# Patient Record
Sex: Male | Born: 1975 | Race: White | Hispanic: No | Marital: Single | Smoking: Current every day smoker
Health system: Southern US, Community
[De-identification: ages and names within clinical notes are randomized; demographics above are authoritative.]

## PROBLEM LIST (undated history)

## (undated) DIAGNOSIS — IMO0001 Reserved for inherently not codable concepts without codable children: Secondary | ICD-10-CM

## (undated) DIAGNOSIS — F1120 Opioid dependence, uncomplicated: Secondary | ICD-10-CM

## (undated) DIAGNOSIS — F1910 Other psychoactive substance abuse, uncomplicated: Secondary | ICD-10-CM

## (undated) DIAGNOSIS — K7460 Unspecified cirrhosis of liver: Secondary | ICD-10-CM

## (undated) DIAGNOSIS — K219 Gastro-esophageal reflux disease without esophagitis: Secondary | ICD-10-CM

## (undated) DIAGNOSIS — F419 Anxiety disorder, unspecified: Secondary | ICD-10-CM

## (undated) DIAGNOSIS — I8500 Esophageal varices without bleeding: Secondary | ICD-10-CM

---

## 2003-12-05 ENCOUNTER — Encounter: Admission: RE | Admit: 2003-12-05 | Discharge: 2003-12-05 | Payer: Self-pay | Admitting: Family Medicine

## 2005-02-09 ENCOUNTER — Encounter: Admission: RE | Admit: 2005-02-09 | Discharge: 2005-02-09 | Payer: Self-pay | Admitting: Family Medicine

## 2005-12-07 ENCOUNTER — Encounter: Admission: RE | Admit: 2005-12-07 | Discharge: 2005-12-07 | Payer: Self-pay | Admitting: Family Medicine

## 2007-01-11 ENCOUNTER — Ambulatory Visit: Payer: Self-pay | Admitting: Internal Medicine

## 2007-01-29 ENCOUNTER — Ambulatory Visit: Payer: Self-pay | Admitting: Internal Medicine

## 2007-02-08 ENCOUNTER — Ambulatory Visit: Payer: Self-pay | Admitting: Internal Medicine

## 2007-02-13 ENCOUNTER — Ambulatory Visit: Payer: Self-pay | Admitting: Internal Medicine

## 2007-06-26 ENCOUNTER — Ambulatory Visit: Payer: Self-pay | Admitting: Internal Medicine

## 2007-06-26 DIAGNOSIS — F411 Generalized anxiety disorder: Secondary | ICD-10-CM | POA: Insufficient documentation

## 2008-03-07 ENCOUNTER — Telehealth (INDEPENDENT_AMBULATORY_CARE_PROVIDER_SITE_OTHER): Payer: Self-pay | Admitting: *Deleted

## 2008-12-18 ENCOUNTER — Ambulatory Visit: Payer: Self-pay | Admitting: Family Medicine

## 2008-12-18 DIAGNOSIS — H6690 Otitis media, unspecified, unspecified ear: Secondary | ICD-10-CM | POA: Insufficient documentation

## 2009-03-04 ENCOUNTER — Emergency Department (HOSPITAL_BASED_OUTPATIENT_CLINIC_OR_DEPARTMENT_OTHER): Admission: EM | Admit: 2009-03-04 | Discharge: 2009-03-04 | Payer: Self-pay | Admitting: Emergency Medicine

## 2011-01-15 ENCOUNTER — Encounter: Payer: Self-pay | Admitting: Family Medicine

## 2011-01-17 ENCOUNTER — Ambulatory Visit: Admit: 2011-01-17 | Payer: Self-pay | Admitting: Gastroenterology

## 2011-01-17 DIAGNOSIS — K7689 Other specified diseases of liver: Secondary | ICD-10-CM

## 2011-04-07 LAB — POCT TOXICOLOGY PANEL
Benzodiazepines: POSITIVE
Opiates: POSITIVE
Tetrahydrocannabinol: POSITIVE

## 2011-04-07 LAB — CBC
HCT: 41.2 % (ref 39.0–52.0)
Hemoglobin: 13.9 g/dL (ref 13.0–17.0)
MCHC: 33.8 g/dL (ref 30.0–36.0)
MCV: 91.7 fL (ref 78.0–100.0)
Platelets: 235 10*3/uL (ref 150–400)
RBC: 4.49 MIL/uL (ref 4.22–5.81)
RDW: 12.7 % (ref 11.5–15.5)
WBC: 6 10*3/uL (ref 4.0–10.5)

## 2011-04-07 LAB — URINALYSIS, ROUTINE W REFLEX MICROSCOPIC
Bilirubin Urine: NEGATIVE
Glucose, UA: NEGATIVE mg/dL
Hgb urine dipstick: NEGATIVE
Ketones, ur: NEGATIVE mg/dL
Nitrite: NEGATIVE
Protein, ur: NEGATIVE mg/dL
Specific Gravity, Urine: 1.027 (ref 1.005–1.030)
Urobilinogen, UA: 0.2 mg/dL (ref 0.0–1.0)
pH: 6.5 (ref 5.0–8.0)

## 2011-04-07 LAB — DIFFERENTIAL
Basophils Absolute: 0.1 10*3/uL (ref 0.0–0.1)
Basophils Relative: 1 % (ref 0–1)
Eosinophils Absolute: 0.3 10*3/uL (ref 0.0–0.7)
Eosinophils Relative: 4 % (ref 0–5)
Lymphocytes Relative: 32 % (ref 12–46)
Lymphs Abs: 1.9 10*3/uL (ref 0.7–4.0)
Monocytes Absolute: 0.4 10*3/uL (ref 0.1–1.0)
Monocytes Relative: 7 % (ref 3–12)
Neutro Abs: 3.3 10*3/uL (ref 1.7–7.7)
Neutrophils Relative %: 56 % (ref 43–77)

## 2011-04-07 LAB — BASIC METABOLIC PANEL
BUN: 10 mg/dL (ref 6–23)
CO2: 29 mEq/L (ref 19–32)
Calcium: 9 mg/dL (ref 8.4–10.5)
Chloride: 104 mEq/L (ref 96–112)
Creatinine, Ser: 1 mg/dL (ref 0.4–1.5)
GFR calc Af Amer: >60 mL/min (ref >60)
GFR calc non Af Amer: >60 mL/min (ref >60)
Glucose, Bld: 83 mg/dL (ref 70–99)
Potassium: 4.3 mEq/L (ref 3.5–5.1)
Sodium: 141 mEq/L (ref 135–145)

## 2011-04-07 LAB — ETHANOL: Alcohol, Ethyl (B): <5 mg/dL (ref 0–10)

## 2011-11-18 ENCOUNTER — Emergency Department (INDEPENDENT_AMBULATORY_CARE_PROVIDER_SITE_OTHER)
Admission: EM | Admit: 2011-11-18 | Discharge: 2011-11-18 | Disposition: A | Discharge status: Home / Self Care | Payer: Self-pay | Source: Home / Self Care | Attending: Family Medicine | Admitting: Family Medicine

## 2011-11-18 DIAGNOSIS — I872 Venous insufficiency (chronic) (peripheral): Secondary | ICD-10-CM

## 2011-11-18 NOTE — ED Notes (Signed)
Reports swelling in both legs for past few months, worse recently

## 2011-11-18 NOTE — ED Provider Notes (Signed)
History     CSN: [id number] Arrival date & time: 11/18/2011 11:35 AM   First MD Initiated Contact with Patient 11/18/11 1122      Chief Complaint  Patient presents with  . Leg Swelling    (Consider location/radiation/quality/duration/timing/severity/associated sxs/prior treatment) HPI Comments: Pt c/o swelling and discoloration to BLE x 6months.  Has happened before, twice in last 10 years, usually resolves after a couple of months but this is not going away.  Denies pain.  Swelling improves at night with legs propped up, but does not ever go away completely.  The history is provided by the patient.    History reviewed. No pertinent past medical history.  History reviewed. No pertinent past surgical history.  History reviewed. No pertinent family history.  History  Substance Use Topics  . Smoking status: Current Everyday Smoker  . Smokeless tobacco: Not on file  . Alcohol Use: Yes      Review of Systems  Constitutional: Negative for fever.  Respiratory: Negative for shortness of breath.   Cardiovascular: Positive for leg swelling. Negative for chest pain.  Musculoskeletal: Negative for myalgias and joint swelling.  Skin: Positive for color change.  Neurological: Negative for weakness.    Allergies  Cefaclor and Penicillins  Home Medications   Current Outpatient Rx  Name Route Sig Dispense Refill  . METHADONE HCL 10 MG/5ML PO SOLN Oral Take by mouth once.        BP 130/68  Pulse 89  Temp(Src) 98 F (36.7 C) (Oral)  Resp 20  SpO2 100%  Physical Exam  Constitutional: He appears well-developed and well-nourished.  Non-toxic appearance. He does not appear ill.       obese  Cardiovascular: Intact distal pulses.   Pulses:      Dorsalis pedis pulses are 2+ on the right side, and 2+ on the left side.       Posterior tibial pulses are 2+ on the right side, and 2+ on the left side.  Pulmonary/Chest: Effort normal.  Musculoskeletal:       Right ankle: He  exhibits swelling. tenderness.       Left ankle: He exhibits swelling. tenderness.       Right lower leg: He exhibits swelling. He exhibits no tenderness.       Left lower leg: He exhibits swelling. He exhibits no tenderness.       Right foot: He exhibits swelling. He exhibits no tenderness.       Left foot: He exhibits swelling. He exhibits no tenderness.       3+ pitting edema BLE from upper calves to toes; no pain with palpation   Skin: Skin is warm and dry.       ED Course  Procedures (including critical care time)  Labs Reviewed - No data to display No results found.   1. Venous insufficiency       MDM  Pt needs f/u with pcp.  [facility]. Pt to get compression stockings from medical supply store to help manage sx in the meantime.  Also referred to podiatry for tx for fungal infections.  Pt will need labs and f/u to appropriately tx this, which cannot be achieved here in [facility].  Pt verbalizes understanding.         Cathlyn Parsons, NP 11/18/11 1243

## 2011-11-18 NOTE — ED Provider Notes (Signed)
Medical screening examination/treatment/procedure(s) were performed by non-physician practitioner and as supervising physician I was immediately available for consultation/collaboration.  LANEY,RONNIE  Ronnie Laney, MD 11/18/11 1630 

## 2011-12-27 DIAGNOSIS — I8500 Esophageal varices without bleeding: Secondary | ICD-10-CM

## 2011-12-27 HISTORY — DX: Esophageal varices without bleeding: I85.00

## 2012-11-24 ENCOUNTER — Emergency Department (HOSPITAL_BASED_OUTPATIENT_CLINIC_OR_DEPARTMENT_OTHER): Payer: Self-pay

## 2012-11-24 ENCOUNTER — Encounter (HOSPITAL_BASED_OUTPATIENT_CLINIC_OR_DEPARTMENT_OTHER): Payer: Self-pay | Admitting: *Deleted

## 2012-11-24 ENCOUNTER — Emergency Department (HOSPITAL_BASED_OUTPATIENT_CLINIC_OR_DEPARTMENT_OTHER)
Admission: EM | Admit: 2012-11-24 | Discharge: 2012-11-24 | Disposition: A | Discharge status: Home / Self Care | Payer: Self-pay | Attending: Emergency Medicine | Admitting: Emergency Medicine

## 2012-11-24 DIAGNOSIS — F17200 Nicotine dependence, unspecified, uncomplicated: Secondary | ICD-10-CM | POA: Insufficient documentation

## 2012-11-24 DIAGNOSIS — M25569 Pain in unspecified knee: Secondary | ICD-10-CM

## 2012-11-24 DIAGNOSIS — X500XXA Overexertion from strenuous movement or load, initial encounter: Secondary | ICD-10-CM | POA: Insufficient documentation

## 2012-11-24 DIAGNOSIS — Z79899 Other long term (current) drug therapy: Secondary | ICD-10-CM | POA: Insufficient documentation

## 2012-11-24 DIAGNOSIS — Y929 Unspecified place or not applicable: Secondary | ICD-10-CM | POA: Insufficient documentation

## 2012-11-24 DIAGNOSIS — S99929A Unspecified injury of unspecified foot, initial encounter: Secondary | ICD-10-CM | POA: Insufficient documentation

## 2012-11-24 DIAGNOSIS — Z8719 Personal history of other diseases of the digestive system: Secondary | ICD-10-CM | POA: Insufficient documentation

## 2012-11-24 DIAGNOSIS — Y939 Activity, unspecified: Secondary | ICD-10-CM | POA: Insufficient documentation

## 2012-11-24 DIAGNOSIS — S8990XA Unspecified injury of unspecified lower leg, initial encounter: Secondary | ICD-10-CM | POA: Insufficient documentation

## 2012-11-24 DIAGNOSIS — Z8679 Personal history of other diseases of the circulatory system: Secondary | ICD-10-CM | POA: Insufficient documentation

## 2012-11-24 HISTORY — DX: Gastro-esophageal reflux disease without esophagitis: K21.9

## 2012-11-24 HISTORY — DX: Esophageal varices without bleeding: I85.00

## 2012-11-24 HISTORY — DX: Reserved for inherently not codable concepts without codable children: IMO0001

## 2012-11-24 MED ORDER — HYDROCODONE-ACETAMINOPHEN 5-325 MG PO TABS: 2.0000 | ORAL_TABLET | ORAL | Status: DC | PRN

## 2012-11-24 NOTE — ED Notes (Addendum)
Pt states he twisted his knee a couple days ago and it felt like something popped out of place. He was able to get in back in then, but did the same thing tonight and could not. Slow to answer questions.

## 2012-11-24 NOTE — ED Provider Notes (Signed)
Medical screening examination/treatment/procedure(s) were performed by non-physician practitioner and as supervising physician I was immediately available for consultation/collaboration.   Konstantine Gervasi Y. Kole Hilyard, MD 11/24/12 2259 

## 2012-11-24 NOTE — ED Provider Notes (Signed)
History     CSN: [id number]  Arrival date & time 11/24/12  4098   First MD Initiated Contact with Patient 11/24/12 2033      Chief Complaint  Patient presents with  . Knee Injury    (Consider location/radiation/quality/duration/timing/severity/associated sxs/prior treatment) HPI Comments: This is a 36 year old male, who presents emergency department with chief complaint of right knee pain. Patient states that he resume timetable and twisted his knee. He felt like the knee caught or dislocated. He has a history of during the same earlier this week. He states that the pain follow twisting. His pain is controlled at rest. He is unable to ambulate, and uses crutches at home. Patient has not tried anything to alleviate his symptoms.  The history is provided by the patient. No language interpreter was used.    Past Medical History  Diagnosis Date  . Reflux   . Esophageal varices     History reviewed. No pertinent past surgical history.  History reviewed. No pertinent family history.  History  Substance Use Topics  . Smoking status: Current Every Day Smoker  . Smokeless tobacco: Not on file  . Alcohol Use: Yes      Review of Systems  All other systems reviewed and are negative.    Allergies  Cefaclor and Penicillins  Home Medications   Current Outpatient Rx  Name  Route  Sig  Dispense  Refill  . PANTOPRAZOLE SODIUM 20 MG PO TBEC   Oral   Take 20 mg by mouth daily.         Marland Kitchen PROPRANOLOL HCL 20 MG PO TABS   Oral   Take 20 mg by mouth 2 (two) times daily.         Marland Kitchen METHADONE HCL 10 MG/5ML PO SOLN   Oral   Take by mouth once.             BP 134/69  Pulse 72  Temp 98.9 F (37.2 C) (Oral)  Resp 20  Ht 5\' 11"  (1.803 m)  Wt 245 lb (111.131 kg)  BMI 34.17 kg/m2  SpO2 99%  Physical Exam  Nursing note and vitals reviewed. Constitutional: He is oriented to person, place, and time. He appears well-developed and well-nourished.  HENT:  Head:  Normocephalic and atraumatic.  Right Ear: External ear normal.  Left Ear: External ear normal.  Nose: Nose normal.  Mouth/Throat: Oropharynx is clear and moist. No oropharyngeal exudate.  Eyes: Conjunctivae normal and EOM are normal. Pupils are equal, round, and reactive to light. Right eye exhibits no discharge. Left eye exhibits no discharge. No scleral icterus.  Neck: Normal range of motion. Neck supple. No JVD present.  Cardiovascular: Normal rate, regular rhythm, normal heart sounds and intact distal pulses.  Exam reveals no gallop and no friction rub.   No murmur heard. Pulmonary/Chest: Effort normal and breath sounds normal. No respiratory distress. He has no wheezes. He has no rales. He exhibits no tenderness.  Abdominal: Soft. Bowel sounds are normal. He exhibits no distension and no mass. There is no tenderness. There is no rebound and no guarding.  Musculoskeletal: Normal range of motion. He exhibits tenderness. He exhibits no edema.       Patient is mildly tender to palpation over the lateral joint line. Lockman, posterior drawer, comparison valgus testing negative. No swelling. Distal pulses intact  Neurological: He is alert and oriented to person, place, and time. He has normal reflexes.       CN 3-12 intact  Skin:  Skin is warm and dry.  Psychiatric: He has a normal mood and affect. His behavior is normal. Judgment and thought content normal.    ED Course  Procedures (including critical care time)  Labs Reviewed - No data to display Dg Knee Complete 4 Views Right  11/24/2012  *RADIOLOGY REPORT*  Clinical Data: Right knee injury with pain.  RIGHT KNEE - COMPLETE 4+ VIEW  Comparison:  None.  Findings:  There is no evidence of fracture, dislocation, or joint effusion.  Minimal medial joint space narrowing is present.  There is no evidence of bony lesion or joint effusion.  IMPRESSION: No acute findings.  Minimal medial joint space narrowing.   Original Report Authenticated By:  Irish Lack, M.D.      1. Knee pain       MDM  36 year old male with right knee pain. I'm going to discharge the patient to home with orthopedic followup. He has crutches. I will give the patient some pain medicine. I have encouraged patient to use ice. Doubt DVT based on Wells Criteria  Patient is stable and ready for discharge.        Roxy Horseman, PA-C 11/24/12 2217  Roxy Horseman, PA-C 11/24/12 2217

## 2013-08-26 IMAGING — CR DG KNEE COMPLETE 4+V*R*
4 series · 4 of 4 positions shown · non-contrast
Comparison: None.

CLINICAL DATA: Right knee injury with pain.

RIGHT KNEE - COMPLETE 4+ VIEW

[t knee ap right]
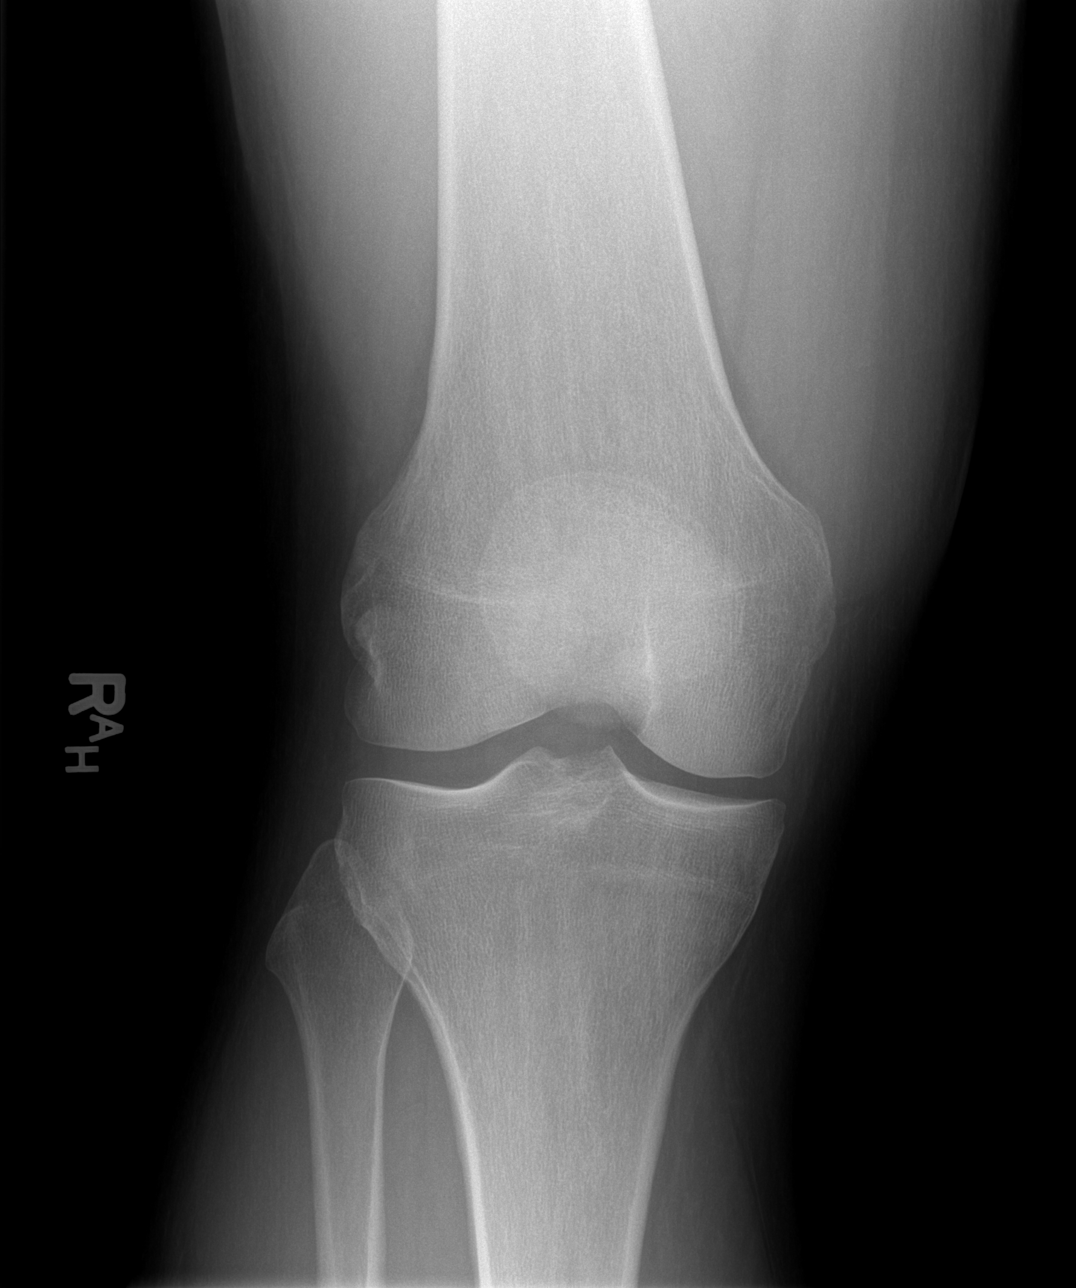

[t knee oblique right (1 of 2)]
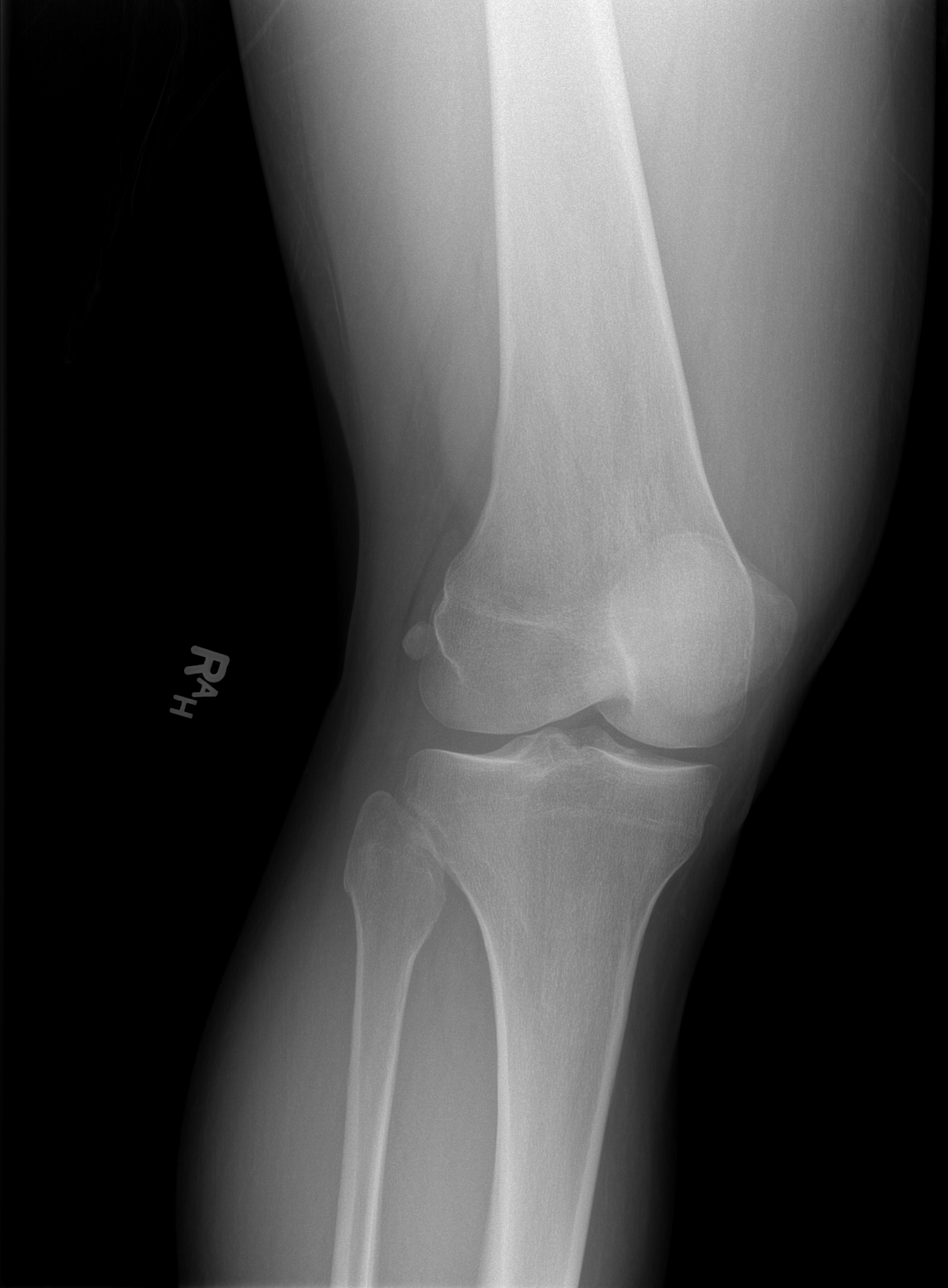

[t knee oblique right (2 of 2)]
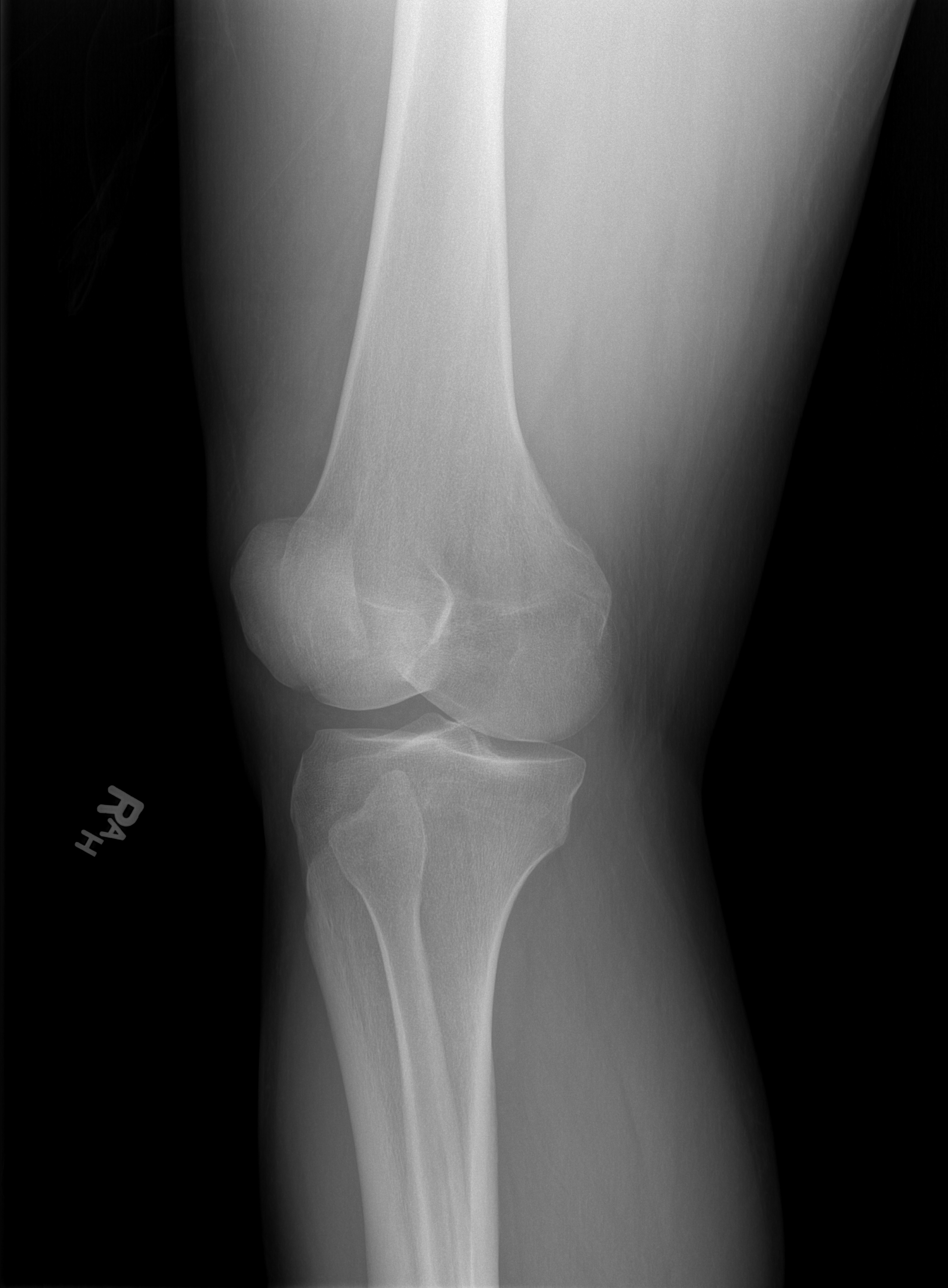

[t knee lat right]
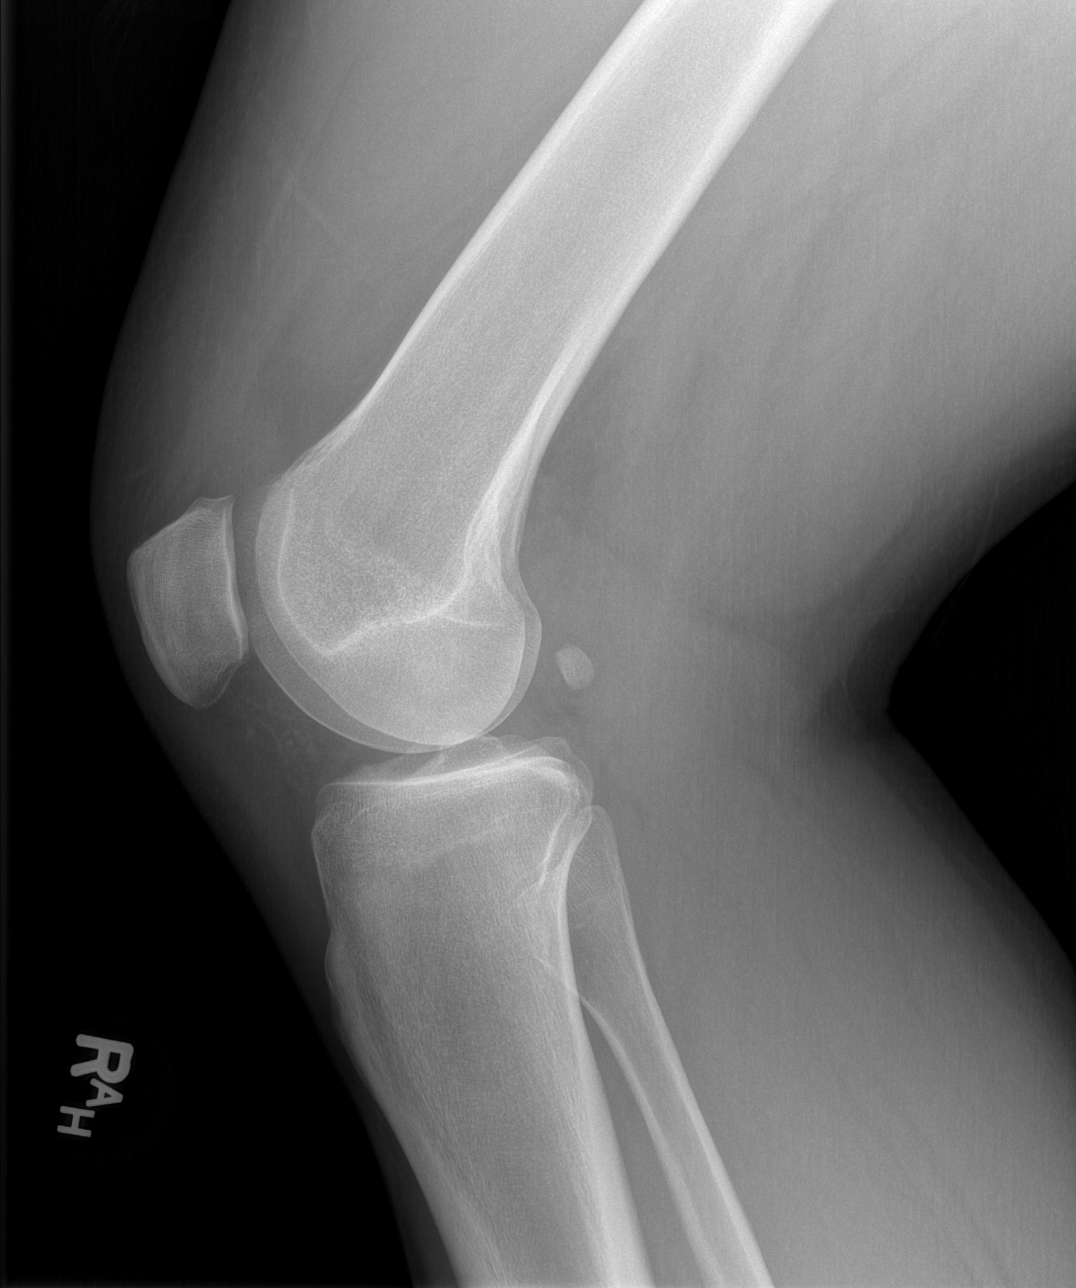

[4 of 4 positions shown; findings below may reference images not displayed]

FINDINGS: There is no evidence of fracture, dislocation, or joint
effusion.  Minimal medial joint space narrowing is present.  There
is no evidence of bony lesion or joint effusion.
IMPRESSION: No acute findings.  Minimal medial joint space narrowing.

## 2014-01-17 ENCOUNTER — Inpatient Hospital Stay (HOSPITAL_COMMUNITY)
Admission: EM | Admit: 2014-01-17 | Discharge: 2014-01-21 | DRG: 432 | Disposition: A | Discharge status: Home / Self Care | Payer: 59 | Attending: Family Medicine | Admitting: Family Medicine

## 2014-01-17 ENCOUNTER — Encounter (HOSPITAL_COMMUNITY): Payer: Self-pay | Admitting: Emergency Medicine

## 2014-01-17 ENCOUNTER — Emergency Department (HOSPITAL_COMMUNITY): Payer: 59

## 2014-01-17 DIAGNOSIS — K920 Hematemesis: Secondary | ICD-10-CM

## 2014-01-17 DIAGNOSIS — D6959 Other secondary thrombocytopenia: Secondary | ICD-10-CM | POA: Diagnosis present

## 2014-01-17 DIAGNOSIS — Z881 Allergy status to other antibiotic agents status: Secondary | ICD-10-CM

## 2014-01-17 DIAGNOSIS — K047 Periapical abscess without sinus: Secondary | ICD-10-CM | POA: Diagnosis present

## 2014-01-17 DIAGNOSIS — K7030 Alcoholic cirrhosis of liver without ascites: Principal | ICD-10-CM | POA: Diagnosis present

## 2014-01-17 DIAGNOSIS — I8511 Secondary esophageal varices with bleeding: Secondary | ICD-10-CM | POA: Diagnosis present

## 2014-01-17 DIAGNOSIS — Z880 Allergy status to penicillin: Secondary | ICD-10-CM

## 2014-01-17 DIAGNOSIS — F411 Generalized anxiety disorder: Secondary | ICD-10-CM | POA: Diagnosis present

## 2014-01-17 DIAGNOSIS — D62 Acute posthemorrhagic anemia: Secondary | ICD-10-CM | POA: Diagnosis not present

## 2014-01-17 DIAGNOSIS — E876 Hypokalemia: Secondary | ICD-10-CM | POA: Diagnosis present

## 2014-01-17 DIAGNOSIS — I868 Varicose veins of other specified sites: Secondary | ICD-10-CM | POA: Diagnosis present

## 2014-01-17 DIAGNOSIS — K7460 Unspecified cirrhosis of liver: Secondary | ICD-10-CM | POA: Diagnosis present

## 2014-01-17 DIAGNOSIS — K7682 Hepatic encephalopathy: Secondary | ICD-10-CM | POA: Diagnosis present

## 2014-01-17 DIAGNOSIS — K922 Gastrointestinal hemorrhage, unspecified: Secondary | ICD-10-CM | POA: Diagnosis present

## 2014-01-17 DIAGNOSIS — K219 Gastro-esophageal reflux disease without esophagitis: Secondary | ICD-10-CM | POA: Diagnosis present

## 2014-01-17 DIAGNOSIS — Z79899 Other long term (current) drug therapy: Secondary | ICD-10-CM

## 2014-01-17 DIAGNOSIS — F1910 Other psychoactive substance abuse, uncomplicated: Secondary | ICD-10-CM

## 2014-01-17 DIAGNOSIS — I8500 Esophageal varices without bleeding: Secondary | ICD-10-CM

## 2014-01-17 DIAGNOSIS — K7290 Hepatic failure, unspecified without coma: Secondary | ICD-10-CM | POA: Diagnosis present

## 2014-01-17 DIAGNOSIS — F1120 Opioid dependence, uncomplicated: Secondary | ICD-10-CM | POA: Diagnosis present

## 2014-01-17 DIAGNOSIS — I872 Venous insufficiency (chronic) (peripheral): Secondary | ICD-10-CM | POA: Diagnosis present

## 2014-01-17 DIAGNOSIS — E8809 Other disorders of plasma-protein metabolism, not elsewhere classified: Secondary | ICD-10-CM | POA: Diagnosis present

## 2014-01-17 DIAGNOSIS — F1021 Alcohol dependence, in remission: Secondary | ICD-10-CM | POA: Diagnosis present

## 2014-01-17 DIAGNOSIS — F17200 Nicotine dependence, unspecified, uncomplicated: Secondary | ICD-10-CM | POA: Diagnosis present

## 2014-01-17 HISTORY — DX: Opioid dependence, uncomplicated: F11.20

## 2014-01-17 HISTORY — DX: Other psychoactive substance abuse, uncomplicated: F19.10

## 2014-01-17 HISTORY — DX: Unspecified cirrhosis of liver: K74.60

## 2014-01-17 HISTORY — DX: Anxiety disorder, unspecified: F41.9

## 2014-01-17 LAB — TYPE AND SCREEN
ABO/RH(D): A POS
Antibody Screen: NEGATIVE

## 2014-01-17 LAB — POCT I-STAT TROPONIN I: Troponin i, poc: 0 ng/mL (ref 0.00–0.08)

## 2014-01-17 LAB — APTT: aPTT: 39 seconds — ABNORMAL HIGH (ref 24–37)

## 2014-01-17 LAB — GLUCOSE, CAPILLARY: Glucose-Capillary: 99 mg/dL (ref 70–99)

## 2014-01-17 LAB — CBC
HCT: 36.5 % — ABNORMAL LOW (ref 39.0–52.0)
HCT: 31.2 % — ABNORMAL LOW (ref 39.0–52.0)
HEMOGLOBIN: 11 g/dL — AB (ref 13.0–17.0)
Hemoglobin: 13 g/dL (ref 13.0–17.0)
MCH: 31.3 pg (ref 26.0–34.0)
MCH: 31 pg (ref 26.0–34.0)
MCHC: 35.6 g/dL (ref 30.0–36.0)
MCHC: 35.3 g/dL (ref 30.0–36.0)
MCV: 88 fL (ref 78.0–100.0)
MCV: 87.9 fL (ref 78.0–100.0)
Platelets: 109 10*3/uL — ABNORMAL LOW (ref 150–400)
Platelets: 61 10*3/uL — ABNORMAL LOW (ref 150–400)
RBC: 4.15 MIL/uL — ABNORMAL LOW (ref 4.22–5.81)
RBC: 3.55 MIL/uL — ABNORMAL LOW (ref 4.22–5.81)
RDW: 14.4 % (ref 11.5–15.5)
RDW: 14 % (ref 11.5–15.5)
WBC: 8 10*3/uL (ref 4.0–10.5)
WBC: 3.9 10*3/uL — ABNORMAL LOW (ref 4.0–10.5)

## 2014-01-17 LAB — COMPREHENSIVE METABOLIC PANEL
ALT: 24 U/L (ref 0–53)
AST: 44 U/L — ABNORMAL HIGH (ref 0–37)
Albumin: 3.4 g/dL — ABNORMAL LOW (ref 3.5–5.2)
Alkaline Phosphatase: 80 U/L (ref 39–117)
BUN: 10 mg/dL (ref 6–23)
CO2: 24 mEq/L (ref 19–32)
Calcium: 8.7 mg/dL (ref 8.4–10.5)
Chloride: 102 mEq/L (ref 96–112)
Creatinine, Ser: 0.54 mg/dL (ref 0.50–1.35)
GFR calc Af Amer: >90 mL/min (ref >90)
GFR calc non Af Amer: >90 mL/min (ref >90)
Glucose, Bld: 99 mg/dL (ref 70–99)
Potassium: 3.5 mEq/L — ABNORMAL LOW (ref 3.7–5.3)
Sodium: 141 mEq/L (ref 137–147)
Total Bilirubin: 1.6 mg/dL — ABNORMAL HIGH (ref 0.3–1.2)
Total Protein: 7 g/dL (ref 6.0–8.3)

## 2014-01-17 LAB — MRSA PCR SCREENING: MRSA by PCR: NEGATIVE

## 2014-01-17 LAB — PROTIME-INR
INR: 1.44 (ref 0.00–1.49)
Prothrombin Time: 17.2 seconds — ABNORMAL HIGH (ref 11.6–15.2)

## 2014-01-17 LAB — HEMOGLOBIN AND HEMATOCRIT, BLOOD
HCT: 31.3 % — ABNORMAL LOW (ref 39.0–52.0)
Hemoglobin: 11 g/dL — ABNORMAL LOW (ref 13.0–17.0)

## 2014-01-17 LAB — ABO/RH: ABO/RH(D): A POS

## 2014-01-17 MED ORDER — ONDANSETRON HCL 4 MG PO TABS: 4.0000 mg | ORAL_TABLET | Freq: Once | ORAL | Status: DC

## 2014-01-17 MED ORDER — OCTREOTIDE LOAD VIA INFUSION: 50.0000 ug | Freq: Once | INTRAVENOUS | Status: DC

## 2014-01-17 MED ORDER — PANTOPRAZOLE SODIUM 40 MG IV SOLR: 40.0000 mg | Freq: Once | INTRAVENOUS | Status: DC

## 2014-01-17 MED ORDER — ALPRAZOLAM 0.5 MG PO TABS
0.5000 mg | ORAL_TABLET | Freq: Every day | ORAL | Status: DC
  Administered 2014-01-17 – 2014-01-20 (×4): 0.5 mg via ORAL
  Filled 2014-01-17 (×4): qty 1

## 2014-01-17 MED ORDER — ONDANSETRON HCL 4 MG/2ML IJ SOLN
INTRAMUSCULAR | Status: AC
  Filled 2014-01-17: qty 2

## 2014-01-17 MED ORDER — ONDANSETRON HCL 4 MG/2ML IJ SOLN: 4.0000 mg | Freq: Four times a day (QID) | INTRAMUSCULAR | Status: DC | PRN

## 2014-01-17 MED ORDER — SODIUM CHLORIDE 0.9 % IV SOLN
80.0000 mg | Freq: Once | INTRAVENOUS | Status: AC
  Administered 2014-01-17: 80 mg via INTRAVENOUS
  Filled 2014-01-17: qty 80

## 2014-01-17 MED ORDER — ONDANSETRON HCL 4 MG/2ML IJ SOLN
4.0000 mg | Freq: Once | INTRAMUSCULAR | Status: AC
  Administered 2014-01-17: 4 mg via INTRAVENOUS

## 2014-01-17 MED ORDER — SODIUM CHLORIDE 0.9 % IV SOLN
Freq: Once | INTRAVENOUS | Status: AC
Start: 2014-01-17 — End: 2014-01-17
  Administered 2014-01-17: 16:00:00 via INTRAVENOUS

## 2014-01-17 MED ORDER — SODIUM CHLORIDE 0.9 % IV SOLN
8.0000 mg/h | INTRAVENOUS | Status: AC
  Administered 2014-01-17 – 2014-01-20 (×7): 8 mg/h via INTRAVENOUS
  Filled 2014-01-17 (×11): qty 80

## 2014-01-17 MED ORDER — SODIUM CHLORIDE 0.9 % IV BOLUS (SEPSIS)
1000.0000 mL | Freq: Once | INTRAVENOUS | Status: AC
  Administered 2014-01-17: 1000 mL via INTRAVENOUS

## 2014-01-17 MED ORDER — SODIUM CHLORIDE 0.9 % IV BOLUS (SEPSIS): 1000.0000 mL | Freq: Once | INTRAVENOUS | Status: DC

## 2014-01-17 MED ORDER — OCTREOTIDE LOAD VIA INFUSION
50.0000 ug | Freq: Once | INTRAVENOUS | Status: AC
  Administered 2014-01-17: 50 ug via INTRAVENOUS
  Filled 2014-01-17: qty 25

## 2014-01-17 MED ORDER — LACTULOSE 10 GM/15ML PO SOLN
10.0000 g | Freq: Two times a day (BID) | ORAL | Status: DC
  Administered 2014-01-17 – 2014-01-19 (×4): 10 g via ORAL
  Filled 2014-01-17 (×6): qty 15

## 2014-01-17 MED ORDER — SODIUM CHLORIDE 0.9 % IV SOLN: 50.0000 ug/h | INTRAVENOUS | Status: DC

## 2014-01-17 MED ORDER — SODIUM CHLORIDE 0.9 % IV SOLN
250.0000 mL | INTRAVENOUS | Status: DC | PRN
  Administered 2014-01-18: 250 mL via INTRAVENOUS

## 2014-01-17 MED ORDER — CIPROFLOXACIN IN D5W 400 MG/200ML IV SOLN
400.0000 mg | Freq: Two times a day (BID) | INTRAVENOUS | Status: DC
  Administered 2014-01-17 – 2014-01-19 (×4): 400 mg via INTRAVENOUS
  Filled 2014-01-17 (×5): qty 200

## 2014-01-17 MED ORDER — METHADONE HCL 10 MG PO TABS
40.0000 mg | ORAL_TABLET | Freq: Every day | ORAL | Status: DC
  Administered 2014-01-17: 40 mg via ORAL
  Filled 2014-01-17 (×2): qty 4

## 2014-01-17 MED ORDER — SODIUM CHLORIDE 0.9 % IV SOLN
Freq: Once | INTRAVENOUS | Status: AC
  Administered 2014-01-17: 16:00:00 via INTRAVENOUS

## 2014-01-17 MED ORDER — SODIUM CHLORIDE 0.9 % IV SOLN
25.0000 ug/h | INTRAVENOUS | Status: AC
  Administered 2014-01-17 (×2): 25 ug/h via INTRAVENOUS
  Administered 2014-01-18 – 2014-01-19 (×3): 50 ug/h via INTRAVENOUS
  Administered 2014-01-20 (×2): 25 ug/h via INTRAVENOUS
  Filled 2014-01-17 (×11): qty 1

## 2014-01-17 NOTE — Consult Note (Signed)
[facility] Consult: 5:07 PM 01/17/2014  LOS: 0 days    Referring Provider: Margie Billet, MD  Primary Care Physician:  Previously Dr Drue Novel and Dr Beverely Low, not seen since 2008 Primary Gastroenterologist:  Dr. Devona Konig at [facility] in [facility]  Addictions provider: [facility]    Reason for Consultation:  hematemesis   HPI: Travis Beasley is a 38 y.o. male.  Cirrhotic with apparent hx varices but no hx of GI bleed in 2013. Hx polysubsance abuse. 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   Presented to ED today after 2 PM episode of hematemesis of clotted red blood, 200 cc or so.  Only one episode.  Nausea better after Zofran in ED.  Stool brown.   Poor appetite since late Dec 2014 when his Mom admitted to [facility] with MRSA sepsis.  Malaise and non-specific feeling unwell for 2 days.  Last week, the methadone was decreased from 40 mg to 30 mg and he felt increasing malaise, so the dose was returned to 40 mg in last 48 hours.   No hx of hematemesis.  In 2013 (perhaps late 2012) he was referred to Dr Conley Rolls and had EGD on 2 occasions (separated by a few months).  Apparently diagnosed with cirrhosis, possibly a mass in the liver (pt denies HCCA),varices, prescribed Propanolol (which he has not taken for many months) and referred to [facility] but he had no insurance so never went to [facility].   Took 400 mg Ibuprofen for dental pain once yesterday.  Generally does not use NSAIDS, ASA.   No ETOH for about one year.  Did snort Heroin 2 days ago, but he denies regular use.   Never injected drugs, tested HIV negative within last 18 months, not sure of Hep B or C status.     Past Medical History  Diagnosis Date  . Reflux   . Esophageal varices 2013    EGD by Dr Devona Konig at [facility]   . Anxiety     followed  before 2008 by Dr Dub Mikes  . Cirrhosis of liver   . Substance abuse     snorting heroin, abuse of ETOH    History reviewed. No pertinent past surgical history.  Prior to Admission medications   Medication Sig Start Date End Date Taking? Authorizing Provider  ALPRAZolam (XANAX PO) Take 1 tablet by mouth at bedtime.   Yes Historical Provider, MD  methadone (DOLOPHINE) 10 MG/5ML solution Take 40 mg by mouth daily.    Yes Historical Provider, MD    Scheduled Meds:   Infusions: . ciprofloxacin 400 mg (01/17/14 1659)  . octreotide (SANDOSTATIN) infusion Stopped (01/17/14 1657)   PRN Meds:    Allergies as of 01/17/2014 - Review Complete 01/17/2014  Allergen Reaction Noted  . Cefaclor Hives 12/18/2008  . Penicillins Hives     Family History No cirrhosis.   History   Social History  . Marital Status: Single    Spouse Name: N/A    Number of Children: N/A  .  Years of Education: [employer] degree completed 2008   Occupational History  . Unemployed.   Social History Main Topics  . Smoking status: Current Every Day Smoker  . Smokeless tobacco: Not on file  . Alcohol Use: Yes  . Drug Use: No  . Sexual Activity:    Social History Narrative   Lives at home with his parents.     REVIEW OF SYSTEMS: Constitutional:  Fatigue, malaise.  Wt up 80 # in the last year. ENT:  In AM he blows up dried blood from nose, but no epistaxis.  Pulm:  No SOB or cough CV:  No palpitations, no LE edema.  GU:  No hematuria, no frequency GI:  Per HPI.   Heme:  No hx of anemia   Transfusions:  None ever Neuro:  No headaches, no peripheral tingling or numbness Derm:  No itching, no rash or sores. Chronic LE edema that will resolve if he sleeps in bed at night.  How Endocrine:  No sweats or chills.  No polyuria or dysuria Immunization:  Not queried.  Travel:  None beyond local counties in last few months.    PHYSICAL EXAM: Vital signs in last 24 hours: Filed  Vitals:   01/17/14 1630  BP: 133/81  Pulse: 84  Temp:   Resp: 15   Wt Readings from Last 3 Encounters:  11/24/12 111.131 kg (245 lb)  12/18/08 111.403 kg (245 lb 9.6 oz)  06/26/07 102.15 kg (225 lb 3.2 oz)    General: obese, somnolent but arousable WM.  Comfortable.  Looks chronically ill and pasty colored.  Head:  No asymmetry or trauma Eyes:  No iceterus, no pallor  Ears:  Not HOH  Nose:  No discharge Mouth:  Dried blood on lips, moist and clear oral MM.  Poor dentition with caries, nubs of teeth Neck:  No mass, no TMG, no JVD Lungs:  Clear.  breathing not labored. Heart: RRR Abdomen:  Soft, obese, not distended.  Not tender.  No mass.  BS hypoactive Rectal: deferred   Musc/Skeltl: no contracted or deformed joints Extremities:  2+ pitting edema from knees to toes, hyperemia/venous insuffieciency type skin changes.   Neurologic:  Oriented x 3.  Somnolent but good historian once aroused.  No asterixis. Skin:  + telangectasia on upper back and upper chest. No jaundice Tattoos:  None seen Nodes:  No cervical adenopathy   Psych:  Affect blunted.   Intake/Output from previous day:   Intake/Output this shift:    LAB RESULTS:  Recent Labs  01/17/14 1500  WBC 8.0  HGB 13.0  HCT 36.5*  PLT 109*   BMET Lab Results  Component Value Date   NA 141 01/17/2014   NA 141 03/04/2009   K 3.5* 01/17/2014   K 4.3 03/04/2009   CL 102 01/17/2014   CL 104 03/04/2009   CO2 24 01/17/2014   CO2 29 03/04/2009   GLUCOSE 99 01/17/2014   GLUCOSE 83 03/04/2009   BUN 10 01/17/2014   BUN 10 03/04/2009   CREATININE 0.54 01/17/2014   CREATININE 1.0 03/04/2009   CALCIUM 8.7 01/17/2014   CALCIUM 9.0 03/04/2009   LFT  Recent Labs  01/17/14 1500  PROT 7.0  ALBUMIN 3.4*  AST 44*  ALT 24  ALKPHOS 80  BILITOT 1.6*   PT/INR Lab Results  Component Value Date   INR 1.44 01/17/2014       protime  17.2  Hepatitis Panel No results found for this basename: HEPBSAG, HCVAB,  HEPAIGM, HEPBIGM,  in the last 72 hours   RADIOLOGY STUDIES: Dg Chest Portable 1 View  01/17/2014   CLINICAL DATA:  Chest pain and hematemesis  EXAM: PORTABLE CHEST - 1 VIEW  COMPARISON:  February 09, 2005  FINDINGS: There is no edema or consolidation. Heart is borderline enlarged with normal pulmonary vascularity. No adenopathy. No pneumothorax. No bone lesions.  IMPRESSION: Heart borderline enlarged.  No edema or consolidation.   Electronically Signed   By: Bretta BangWilliam  Woodruff M.D.   On: 01/17/2014 15:35    ENDOSCOPIC STUDIES: Dr Devona Konigri Le did EGD x 2 in 2013.  Apparently found varices.    IMPRESSION:   *  Acute hematemesis in pt who carries diagnoses of esophageal varices and cirrhosis.  Rule out variceal bleed, rule out ulcer bleed, rule out portal gastropathy.  *  Cirrhosis of the liver.  Hep B and C status not known. 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  Has been on and off methadone maintenance for several years.  Current maintenance program for about 12 months.   *  Social stressors:  Mother inpt with MRSA sepsis    PLAN:     *  EGD tomorrow, time set for 9 AM.   Will need intubation for the EGD (Dr Marina GoodellPerry request) thus it will be necessary to admit to CCM/ICU where resp therapist can intubate the pt for the procedure.  At present does not look like he needs intubation.  *  Octreotide, PPI, Abx (cipro since pcn allergic) initiated. *  Since he is narcotics dependent, Methadone needs to be continued.  Ok for pt to have meds with sips.  *  CBC q 8 hours.  Ordered hepatitis panel, tox screen.     Jennye MoccasinSarah Gribbin  01/17/2014, 5:07 PM Pager: [phone]  GI ATTENDING  History, laboratories reviewed. Case reviewed in detail with advanced extender. Agree with H&P as outlined above. Complicated 38 year old new to this health system. [facility]. Has a reported history of liver disease and varices. No  records. Presents with an episode of hematemesis. Vital signs are stable and hemoglobin normal. Mild thrombocytopenia. Coags reasonable. Ongoing substance abuse. The patient should be admitted to the intensive care. Agree with empiric initiation of octreotide therapy for possible variceal bleeding. As well IV PPI therapy for possible acid peptic disorder. Agree with antibiotics for sclerotic with GI bleeding. Plans for endoscopy in a.m. if remains reasonably stable. Quite stable now. He will need to be intubated for the procedure, particularly given his substance abuse issues, to secure his airway should therapy for varices be required.  Wilhemina BonitoJohn N. Eda KeysPerry, Jr., M.D. [facility] Division of Gastroenterology

## 2014-01-17 NOTE — ED Provider Notes (Signed)
CSN: [id number]     Arrival date & time 01/17/14  1439 History   First MD Initiated Contact with Patient 01/17/14 1501     Chief Complaint  Patient presents with  . Hematemesis  . Chest Pain    HPI: Travis Beasley is a 38 yo M with history of cirrhosis, opiate abuse and esophageal varices who presents with hematemesis. Over the last week he has been titrating down his methadone dose, consequently has been feeling more fatigued. Today he became nauseated acutely and felt as if he needed to have a BM. He sat on the toilet but did not have a BM. He vomited approximately 200 cc of dark red blood. He continues to feel nauseated but has had no further hematemesis. He had one BM today that was dark brown but no black or bloody stools. No history of GERD, gastritis or esophagitis. Denies chest pain, SOB, diarrhea, urinary symptoms, fever or chills. He also complains of fatigue for one week.    Past Medical History  Diagnosis Date  . Reflux   . Esophageal varices    History reviewed. No pertinent past surgical history. History reviewed. No pertinent family history. History  Substance Use Topics  . Smoking status: Current Every Day Smoker  . Smokeless tobacco: Not on file  . Alcohol Use: Yes    Review of Systems  Constitutional: Positive for appetite change (decreased) and fatigue. Negative for fever and chills.  HENT: Negative for nosebleeds.   Eyes: Negative for photophobia and visual disturbance.  Respiratory: Negative for cough and shortness of breath.   Cardiovascular: Negative for chest pain and leg swelling.  Gastrointestinal: Positive for nausea and vomiting. Negative for abdominal pain, diarrhea, constipation and blood in stool.       + Hematemesis   Genitourinary: Negative for dysuria, frequency and decreased urine volume.  Musculoskeletal: Negative for arthralgias, back pain, gait problem and myalgias.  Skin: Positive for pallor. Negative for wound. Color change: stasis dermatitis  bilateral LE.   Neurological: Positive for dizziness. Negative for syncope, light-headedness and headaches.  Psychiatric/Behavioral: Negative for confusion and agitation.  All other systems reviewed and are negative.    Allergies  Cefaclor and Penicillins  Home Medications   Current Outpatient Rx  Name  Route  Sig  Dispense  Refill  . HYDROcodone-acetaminophen (NORCO/VICODIN) 5-325 MG per tablet   Oral   Take 2 tablets by mouth every 4 (four) hours as needed for pain.   15 tablet   0   . methadone (DOLOPHINE) 10 MG/5ML solution   Oral   Take by mouth once.           . pantoprazole (PROTONIX) 20 MG tablet   Oral   Take 20 mg by mouth daily.         . propranolol (INDERAL) 20 MG tablet   Oral   Take 20 mg by mouth 2 (two) times daily.          BP 166/69  Pulse 121  Temp(Src) 98.4 F (36.9 C) (Oral)  Resp 21  SpO2 98% Physical Exam  Nursing note and vitals reviewed. Constitutional: He is oriented to person, place, and time. He appears ill (chronically ill appearance). No distress.  Pale, middle age male, sitting up in bed, appears anxious, no acute distress   HENT:  Head: Normocephalic and atraumatic.  Mouth/Throat: Mucous membranes are dry.  Dry blood around mouth   Eyes: Conjunctivae and EOM are normal. Pupils are equal, round, and reactive to  light.  Neck: Normal range of motion. Neck supple.  Cardiovascular: Normal rate, regular rhythm, normal heart sounds and intact distal pulses.   Pulmonary/Chest: Effort normal and breath sounds normal. No respiratory distress.  Abdominal: Soft. Bowel sounds are normal. There is no tenderness. There is no rebound and no guarding.  Musculoskeletal: Normal range of motion. He exhibits edema (1+ LE, symmetric). He exhibits no tenderness.  Stasis dermatitis bilaterally   Neurological: He is alert and oriented to person, place, and time. No cranial nerve deficit. Coordination normal.  Skin: Skin is warm and dry. No rash  noted.  Psychiatric: He has a normal mood and affect. His behavior is normal.    ED Course  Procedures (including critical care time) Labs Review Labs Reviewed  CBC - Abnormal; Notable for the following:    RBC 4.15 (*)    HCT 36.5 (*)    All other components within normal limits  COMPREHENSIVE METABOLIC PANEL - Abnormal; Notable for the following:    Potassium 3.5 (*)    Albumin 3.4 (*)    AST 44 (*)    Total Bilirubin 1.6 (*)    All other components within normal limits  PROTIME-INR - Abnormal; Notable for the following:    Prothrombin Time 17.2 (*)    All other components within normal limits  APTT - Abnormal; Notable for the following:    aPTT 39 (*)    All other components within normal limits  POCT I-STAT TROPONIN I  TYPE AND SCREEN  ABO/RH   Imaging Review Dg Chest Portable 1 View  01/17/2014   CLINICAL DATA:  Chest pain and hematemesis  EXAM: PORTABLE CHEST - 1 VIEW  COMPARISON:  February 09, 2005  FINDINGS: There is no edema or consolidation. Heart is borderline enlarged with normal pulmonary vascularity. No adenopathy. No pneumothorax. No bone lesions.  IMPRESSION: Heart borderline enlarged.  No edema or consolidation.   Electronically Signed   By: Bretta BangWilliam  Woodruff M.D.   On: 01/17/2014 15:35    EKG Interpretation   None       MDM  38 yo M with history of cirrhosis and esophageal varices who presents with hematemesis. Approximately 200 cc. No active bleeding. Initially tachycardic to 110's, hypertensive to 160. Concern for variceal bleeding. Two IV's placed. Started octreotide gtt and given Protonix 80 mg. Consulted GI who agreed with above treatment. They requested ICU admission. He was resuscitated with IVF's with improvement of his HR down to 90's. He remained HD stable, no recurrent bleeding. Mild elevation of INR to 1.4, normal lytes and Hgb 13. Admitted to ICU in stable condition. The patient and his father were updated on this plan, they were in agreement  with plan.   Reviewed imaging, labs and previous medical records, utilized in MDM  Discussed case with Dr. Micheline Mazeocherty  Clinical Impression 1. Hematemesis 2. H/o esophageal varices    Margie BilletMathias Aryani Daffern, MD 01/19/14 0100

## 2014-01-17 NOTE — ED Notes (Signed)
GI to bedside, father at bedside and updated

## 2014-01-17 NOTE — ED Notes (Signed)
Please call dad at [phone] with bed assignment.  He is in [facility] with his wife.

## 2014-01-17 NOTE — ED Notes (Signed)
Alert, NAD, calm, interactive, speech clear, sitting upright for comfort, no s/sx of dyspnea, VSS. Denies nausea. Mentions toothache, rates 6/10, onset last night, last had advil for toothache yesterday.

## 2014-01-17 NOTE — ED Notes (Signed)
Up on monitor with RN, report called by BTM, RN to 

## 2014-01-17 NOTE — Progress Notes (Signed)
eLink Physician-Brief Progress Note Patient Name: Travis PageRobert P Beasley DOB: 1976-05-08 MRN: [id number]  Date of Service  01/17/2014   HPI/Events of Note  Called by EDP for admission 38 y/o with cirrhosis, UGIB; only bled once in ED, HD stable, Hgb 13, octreotide/pantoprazole drips  eICU Interventions  Admission orders placed Bedside MD to evaluate   Intervention Category Major Interventions: Hemorrhage - evaluation and management  MCQUAID, DOUGLAS 01/17/2014, 6:29 PM

## 2014-01-17 NOTE — ED Notes (Signed)
Pt reports that he started vomiting blood this afternoon about 45 minutes PTA here. Reports that hx of esophageal varies and cirrhosis. Reports nausea and pain in chest and throat. Reports that he has been taking methadone for opiate addiction. Pt is pale and weak at triage.

## 2014-01-17 NOTE — H&P (Addendum)
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  SIGNIFICANT EVENTS / STUDIES:  1. Hematemesis 01/17/2014  LINES / TUBES: 1. Peripheral IVs only  CULTURES: 1. None  ANTIBIOTICS: 1. Cipro 1/23 at-  HISTORY OF PRESENT ILLNESS:  Mr. 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 Both were low volume. Th the recent hematemesis was preceded by a prodrome of "not feeling well" in the setting of a medically-supervised withdrawal program. He was told by the physician at the withdrawal program in fact he did not look good yesterday and was encouraged to present for attention, however, did not. The first episode of hematemesis earlier today occurred while Mr. Travis Beasley was playing video games. He denies recent fevers, chills, nausea, diarrhea, or increased confusion.   He tells me that he has not used non--prescribed opiates in several weeks, however, told the gastroenterologist earlier today that his last heroin use was 2 days ago. He denied injecting heroin into the gastroenterologist earlier today, however, reports to me that he has been in the past. He reports that he takes 1 mg of Xanax every night and has withdrawn from in the past.  PAST MEDICAL HISTORY :  Past Medical History  Diagnosis Date  . Reflux   . Esophageal varices 2013    EGD by Dr Devona Konigri Le at [facility]  . Anxiety     followed  before 2008 by Dr Dub MikesLugo  . Cirrhosis of liver   . Substance abuse     snorting heroin, abuse of ETOH  . Opiate addiction 01/17/2014    History reviewed.  No pertinent past surgical history.  Prior to Admission medications   Medication Sig Start Date End Date Taking? Authorizing Provider  ALPRAZolam (XANAX PO) Take 1 tablet by mouth at bedtime.   Yes Historical Provider, MD  methadone (DOLOPHINE) 10 MG/5ML solution Take 40 mg by mouth daily.    Yes Historical Provider, MD    Allergies  Allergen Reactions  . Cefaclor Hives  . Penicillins Hives    FAMILY HISTORY:  History reviewed. No pertinent family history.   SOCIAL HISTORY:  reports that he has been smoking.  He does not have any smokeless tobacco history on file. He reports that he drinks alcohol. He reports that he does not use illicit drugs.  REVIEW OF SYSTEMS:  A 12-system ROS was conducted and, unless otherwise specified in the HPI, was negative.   PHYSICAL EXAM  VITAL SIGNS: Temp:  [98.4 F (36.9 C)] 98.4 F (36.9 C) (01/23 1457) Pulse Rate:  [84-121] 92 (01/23 1930) Resp:  [12-21] 12 (01/23 1930) BP: (121-183)/(60-90) 130/72 mmHg (01/23 1930) SpO2:  [94 %-100 %] 99 % (01/23 1930)  HEMODYNAMICS:    VENTILATOR SETTINGS:    INTAKE / OUTPUT: Intake/Output   None     PHYSICAL EXAMINATION: General:  No acute distress, slightly ashen color Neuro:  mild bilateral asterixis HEENT:  sclera anicteric, conjunctiva pink. Mucous membranes moist, oropharynx clear Neck:  trachea supple and midline, no lymphadenopathy or JVD Cardiovascular:  Regular rate rhythm, normal S1-S2, no murmurs rubs or gallops Lungs:  clear to auscultation bilaterally Abddomen:  S/NT/Mildly Distended/(+) BS Musculoskeletal:  2+ Pitting edema to knees bilaterally Skin:  Chronic venous stasis changes bilaterally   LABS:  CBC Recent Labs     01/17/14  1500  WBC  8.0  HGB  13.0  HCT  36.5*  PLT  109*    Coag's Recent Labs     01/17/14  1500  APTT  39*  INR  1.44    BMET Recent Labs     01/17/14  1500  NA  141  K  3.5*  CL  102  CO2  24  BUN  10  CREATININE  0.54   GLUCOSE  99    Electrolytes Recent Labs     01/17/14  1500  CALCIUM  8.7    Sepsis Markers No results found for this basename: LACTICACIDVEN, PROCALCITON, O2SATVEN,  in the last 72 hours  ABG No results found for this basename: PHART, PCO2ART, PO2ART,  in the last 72 hours  Liver Enzymes Recent Labs     01/17/14  1500  AST  44*  ALT  24  ALKPHOS  80  BILITOT  1.6*  ALBUMIN  3.4*    Cardiac Enzymes No results found for this basename: TROPONINI, PROBNP,  in the last 72 hours  Glucose No results found for this basename: GLUCAP,  in the last 72 hours  Imaging Dg Chest Portable 1 View  01/17/2014   CLINICAL DATA:  Chest pain and hematemesis  EXAM: PORTABLE CHEST - 1 VIEW  COMPARISON:  February 09, 2005  FINDINGS: There is no edema or consolidation. Heart is borderline enlarged with normal pulmonary vascularity. No adenopathy. No pneumothorax. No bone lesions.  IMPRESSION: Heart borderline enlarged.  No edema or consolidation.   Electronically Signed   By: Bretta Bang M.D.   On: 01/17/2014 15:35   CXR: Chest x-ray from today was personally reviewed by me. It is normal.  ASSESSMENT / PLAN: Principal Problem:   Hematemesis Active Problems:   Cirrhosis   Hepatic encephalopathy   ANXIETY   Opiate addiction   Hypokalemia   PULMONARY A/P: 1. No acute issue  CARDIOVASCULAR A/P:  1. No acute issue  RENAL A/P:  1. Hypokalemia:  Replete to goal K of 4  GASTROINTESTINAL A/P:  1. Presumed variceal hemorrhage: Fortunately, Mr. Zatarain appears to be hemodynamically stable. His initial hemoglobin and did not reveal significant blood loss.  Octreotide and protonic drips  Serial CBCs  Type and cross 4 units of packed red blood cells  Empiric Cipro as has penicillin allergy  Plan for endoscopy tomorrow at 9 AM; will require intubation prior   Will ask nursing to place at least one more large bore IV  HEMATOLOGIC A/P:   1. No acute  issue  INFECTIOUS A/P: 1. No acute issue  ENDOCRINE A/P: 1. No acute issue  NEUROLOGIC A/P: 1. Hepatic Encephalopathy: Mild and likely 2/2 acute hemorrhage.  2. Anxiety: Cannot simply stop the Xanax as patient is likely to withdraw. Given the likelihood of decompensation, will reduce dose. 3. Opiate addiction: Unable to verify patient's current methadone dose, no answer at his current clinic [facility] [phone]).   Start low-dose lactulose; target 3+ bowel movements daily  Decrease Xanax to 0.5 mg each bedtime  Will provide dose of 40 mg of methadone  Need to verify patient's methadone dose tomorrow morning  BEST PRACTICE / DISPOSITION Level of Care:  ICU Consultants:  GI Code Status:  Full  Diet:  NPO except sips DVT Px:  SCDs GI Px:  PPI Drip Skin Integrity:  Intact Social / Family:  Patient able to update  TODAY'S SUMMARY:   I have personally obtained a history, examined the patient, evaluated laboratory and imaging results, formulated the assessment and plan and placed orders.  CRITICAL CARE: The patient is critically ill with multiple organ systems failure and requires high complexity decision making for assessment and support, frequent evaluation and titration of therapies, application of advanced monitoring technologies and extensive interpretation of multiple databases. Critical Care Time devoted to patient care services described in this note is 45 minutes.   Evalyn Casco, MD Pulmonary and Critical Care Medicine [facility] Pager: [phone]  01/17/2014, 7:47 PM

## 2014-01-18 ENCOUNTER — Encounter (HOSPITAL_COMMUNITY)
Admission: EM | Disposition: A | Discharge status: Home / Self Care | Payer: Self-pay | Source: Home / Self Care | Attending: Pulmonary Disease

## 2014-01-18 ENCOUNTER — Encounter (HOSPITAL_COMMUNITY): Payer: Self-pay | Admitting: *Deleted

## 2014-01-18 DIAGNOSIS — F1120 Opioid dependence, uncomplicated: Secondary | ICD-10-CM

## 2014-01-18 DIAGNOSIS — K7460 Unspecified cirrhosis of liver: Secondary | ICD-10-CM

## 2014-01-18 DIAGNOSIS — F1910 Other psychoactive substance abuse, uncomplicated: Secondary | ICD-10-CM

## 2014-01-18 DIAGNOSIS — I8500 Esophageal varices without bleeding: Secondary | ICD-10-CM

## 2014-01-18 DIAGNOSIS — K920 Hematemesis: Secondary | ICD-10-CM

## 2014-01-18 HISTORY — PX: ESOPHAGOGASTRODUODENOSCOPY: SHX5428

## 2014-01-18 HISTORY — PX: INTUBATION: PRO89

## 2014-01-18 LAB — CBC
HCT: 32.3 % — ABNORMAL LOW (ref 39.0–52.0)
HCT: 33.1 % — ABNORMAL LOW (ref 39.0–52.0)
HEMATOCRIT: 32.3 % — AB (ref 39.0–52.0)
HEMOGLOBIN: 11.5 g/dL — AB (ref 13.0–17.0)
Hemoglobin: 11.4 g/dL — ABNORMAL LOW (ref 13.0–17.0)
Hemoglobin: 11.1 g/dL — ABNORMAL LOW (ref 13.0–17.0)
MCH: 31.4 pg (ref 26.0–34.0)
MCH: 30.5 pg (ref 26.0–34.0)
MCH: 30.7 pg (ref 26.0–34.0)
MCHC: 35.3 g/dL (ref 30.0–36.0)
MCHC: 34.4 g/dL (ref 30.0–36.0)
MCHC: 34.7 g/dL (ref 30.0–36.0)
MCV: 89 fL (ref 78.0–100.0)
MCV: 88.7 fL (ref 78.0–100.0)
MCV: 88.3 fL (ref 78.0–100.0)
PLATELETS: 69 10*3/uL — AB (ref 150–400)
Platelets: 66 10*3/uL — ABNORMAL LOW (ref 150–400)
Platelets: 74 10*3/uL — ABNORMAL LOW (ref 150–400)
RBC: 3.63 MIL/uL — ABNORMAL LOW (ref 4.22–5.81)
RBC: 3.64 MIL/uL — AB (ref 4.22–5.81)
RBC: 3.75 MIL/uL — ABNORMAL LOW (ref 4.22–5.81)
RDW: 14.1 % (ref 11.5–15.5)
RDW: 14.1 % (ref 11.5–15.5)
RDW: 14 % (ref 11.5–15.5)
WBC: 5.1 10*3/uL (ref 4.0–10.5)
WBC: 6.8 10*3/uL (ref 4.0–10.5)
WBC: 6.5 10*3/uL (ref 4.0–10.5)

## 2014-01-18 LAB — HEPATITIS PANEL, ACUTE
HCV AB: NEGATIVE
Hep A IgM: NONREACTIVE
Hep B C IgM: NONREACTIVE
Hepatitis B Surface Ag: NEGATIVE

## 2014-01-18 LAB — BASIC METABOLIC PANEL
BUN: 9 mg/dL (ref 6–23)
CO2: 25 mEq/L (ref 19–32)
Calcium: 8.4 mg/dL (ref 8.4–10.5)
Chloride: 107 mEq/L (ref 96–112)
Creatinine, Ser: 0.56 mg/dL (ref 0.50–1.35)
GFR calc Af Amer: >90 mL/min (ref >90)
Glucose, Bld: 91 mg/dL (ref 70–99)
Potassium: 4.3 mEq/L (ref 3.7–5.3)
Sodium: 141 mEq/L (ref 137–147)

## 2014-01-18 LAB — HEMOGLOBIN AND HEMATOCRIT, BLOOD
HCT: 32.2 % — ABNORMAL LOW (ref 39.0–52.0)
HCT: 33.3 % — ABNORMAL LOW (ref 39.0–52.0)
HEMATOCRIT: 32.5 % — AB (ref 39.0–52.0)
HEMATOCRIT: 30.5 % — AB (ref 39.0–52.0)
HEMOGLOBIN: 11.3 g/dL — AB (ref 13.0–17.0)
HEMOGLOBIN: 11.6 g/dL — AB (ref 13.0–17.0)
Hemoglobin: 11.3 g/dL — ABNORMAL LOW (ref 13.0–17.0)
Hemoglobin: 10.5 g/dL — ABNORMAL LOW (ref 13.0–17.0)

## 2014-01-18 LAB — GLUCOSE, CAPILLARY
Glucose-Capillary: 93 mg/dL (ref 70–99)
Glucose-Capillary: 104 mg/dL — ABNORMAL HIGH (ref 70–99)

## 2014-01-18 LAB — PROTIME-INR
INR: 1.39 (ref 0.00–1.49)
Prothrombin Time: 16.7 seconds — ABNORMAL HIGH (ref 11.6–15.2)

## 2014-01-18 LAB — PHOSPHORUS: Phosphorus: 3.6 mg/dL (ref 2.3–4.6)

## 2014-01-18 LAB — MAGNESIUM: Magnesium: 1.9 mg/dL (ref 1.5–2.5)

## 2014-01-18 SURGERY — EGD (ESOPHAGOGASTRODUODENOSCOPY)
Anesthesia: Moderate Sedation

## 2014-01-18 MED ORDER — PROPOFOL 10 MG/ML IV EMUL
INTRAVENOUS | Status: AC
  Administered 2014-01-18: 1000 mg
  Filled 2014-01-18: qty 100

## 2014-01-18 MED ORDER — ETOMIDATE 2 MG/ML IV SOLN
0.3000 mg/kg | Freq: Once | INTRAVENOUS | Status: DC
Start: 2014-01-18 — End: 2014-01-18

## 2014-01-18 MED ORDER — ETOMIDATE 2 MG/ML IV SOLN
40.0000 mg | Freq: Once | INTRAVENOUS | Status: AC
  Administered 2014-01-18: 40 mg via INTRAVENOUS
  Filled 2014-01-18: qty 20

## 2014-01-18 MED ORDER — ROCURONIUM BROMIDE 50 MG/5ML IV SOLN
1.0000 mg/kg | Freq: Once | INTRAVENOUS | Status: DC
Start: 2014-01-18 — End: 2014-01-18
  Filled 2014-01-18: qty 12.03

## 2014-01-18 MED ORDER — MIDAZOLAM HCL 2 MG/2ML IJ SOLN
INTRAMUSCULAR | Status: AC
  Filled 2014-01-18: qty 4

## 2014-01-18 MED ORDER — SODIUM CHLORIDE 0.9 % IV SOLN: INTRAVENOUS | Status: DC

## 2014-01-18 MED ORDER — ROCURONIUM BROMIDE 50 MG/5ML IV SOLN
100.0000 mg | Freq: Once | INTRAVENOUS | Status: AC
  Administered 2014-01-18: 100 mg via INTRAVENOUS

## 2014-01-18 MED ORDER — MIDAZOLAM HCL 2 MG/2ML IJ SOLN
INTRAMUSCULAR | Status: AC
  Administered 2014-01-18: 6 mg
  Filled 2014-01-18: qty 6

## 2014-01-18 MED ORDER — FENTANYL CITRATE 0.05 MG/ML IJ SOLN: 300.0000 ug | Freq: Once | INTRAMUSCULAR | Status: AC

## 2014-01-18 MED ORDER — FENTANYL CITRATE 0.05 MG/ML IJ SOLN
INTRAMUSCULAR | Status: AC
  Administered 2014-01-18: 300 ug
  Filled 2014-01-18: qty 6

## 2014-01-18 MED ORDER — PROPOFOL 10 MG/ML IV EMUL: 5.0000 ug/kg/min | INTRAVENOUS | Status: DC

## 2014-01-18 MED ORDER — FENTANYL CITRATE 0.05 MG/ML IJ SOLN
INTRAMUSCULAR | Status: AC
  Filled 2014-01-18: qty 4

## 2014-01-18 MED ORDER — MIDAZOLAM HCL 2 MG/2ML IJ SOLN: 6.0000 mg | Freq: Once | INTRAMUSCULAR | Status: AC

## 2014-01-18 MED ORDER — INFLUENZA VAC SPLIT QUAD 0.5 ML IM SUSP
0.5000 mL | INTRAMUSCULAR | Status: DC
  Filled 2014-01-18: qty 0.5

## 2014-01-18 NOTE — Procedures (Signed)
Procedure: Intubation  Indications: Pre-procedure for bedside endoscopy   Procedure Details  Consent: Risks of procedure as well as the alternatives and risks of each were explained to the patient and caregiver. Consent for procedure obtained.   Time Out: Verified patient identification, verified procedure, site/side was marked, verified correct patient position, special equipment/implants available, medications/allergies/relevent history reviewed, required imaging and test results available. Performed   Glidescope used.   Evaluation  Hemodynamic Status: B/P and  O2 sats: stable throughout w/ sats in mid 90s  Patient's Current Condition: stable  Complications: No apparent complications  Patient did tolerate procedure well.    Tammy Parrett NP-C  [place] [facility]  [phone]   Levy Pupaobert Anjuli Gemmill, MD, PhD 01/18/2014, 4:01 PM [place] [facility] [phone] or if no answer [phone]

## 2014-01-18 NOTE — Op Note (Signed)
[facility] [address] [place], [id number]   ENDOSCOPY PROCEDURE REPORT  PATIENT: [name], Travis P.  MR#: [id number] BIRTHDATE: 06/03/1976 , 37  yrs. old GENDER: Male ENDOSCOPIST: Roxy CedarJohn N Buffie Herne Jr, MD REFERRED BY:  [facility] PROCEDURE DATE:  01/18/2014 PROCEDURE:  EGD w/ band ligation of varices  x 4 ASA CLASS:     Class IV INDICATIONS:  Hematemesis. MEDICATIONS: General endotracheal anesthesia (GETA)   in ICU TOPICAL ANESTHETIC: none  DESCRIPTION OF PROCEDURE: After the risks benefits and alternatives of the procedure were thoroughly explained, informed consent was obtained.  The PENTAX GASTOROSCOPE [id number] endoscope was introduced through the mouth and advanced to the second portion of the duodenum. Without limitations.  The instrument was slowly withdrawn as the mucosa was fully examined.    EXAM:The esophagus revealed 4 columns of 3+ varices.  In the distalmost portion, high-risk stigmata such as red spots and vascular ectasia.  However, no active bleeding.  The stomach revealed possible gastric varices but was otherwise normal with one small mobile blood clot.  Normal duodenum.  Retroflexed views revealed varices. THERAPY: The band ligator was used to band 4 large distal variceal regions with the high-risk stigmata. No bleeding precipitated.    The scope was then withdrawn from the patient and the procedure completed.  COMPLICATIONS: There were no complications. ENDOSCOPIC IMPRESSION: 1. Large high risk esophageal varices likely the cause for bleeding. Status post band ligation x4 2. Proximal gastric varices. Otherwise normal exam.  RECOMMENDATIONS: 1.  Continue octreotide for 72 hours 2.  Sips of clear liquids only for now and monitor for evidence of rebleed 3..  Repeat Endoscopy with band ligation in about 2 weeks. He can have this done with his primary GI physician [place] We'll follow.Marland Kitchen.Marland Kitchen.Discussed with family  REPEAT  EXAM:  eSigned:  Roxy CedarJohn N Coree Brame Jr, MD 01/18/2014 3:33 PM   CC:The Patient and Dr Nedra HaiLee, [place]

## 2014-01-18 NOTE — Progress Notes (Signed)
Pt placed on 100% NRB prior to intubation for preoxygenation.

## 2014-01-18 NOTE — Progress Notes (Signed)
Upon arrival pt had self-extubated and was placed on 3 LPM by RN. Pt is talking and no complications noted. Vitals are stable. RT will continue to monitor.

## 2014-01-18 NOTE — Progress Notes (Signed)
          Daily Rounding Note  01/18/2014, 8:28 AM  LOS: 1 day   SUBJECTIVE:       Stool smear dark and smelled of melena per RN.  No nausea or vomiting. No pain   OBJECTIVE:         Vital signs in last 24 hours:    Temp:  [98.4 F (36.9 C)-99.7 F (37.6 C)] 99.1 F (37.3 C) (01/24 0819) Pulse Rate:  [80-121] 80 (01/24 0700) Resp:  [11-21] 16 (01/24 0700) BP: (100-183)/(43-93) 111/57 mmHg (01/24 0700) SpO2:  [93 %-100 %] 96 % (01/24 0700) Weight:  [120.3 kg (265 lb 3.4 oz)-120.339 kg (265 lb 4.8 oz)] 120.3 kg (265 lb 3.4 oz) (01/24 0500)   General: pale otherwise looks wee   Heart: rrr Chest: clear, unlabored resps Abdomen: soft, nt, active bs  Extremities: + edema Neuro/Psych:  Alert, relaxed.   Intake/Output from previous day: 01/23 0701 - 01/24 0700 In: 761.3 [I.V.:361.3; IV Piggyback:400] Out: -   Intake/Output this shift: Total I/O In: 35 [I.V.:35] Out: 425 [Urine:425]  Lab Results:  Recent Labs  01/17/14 1500 01/17/14 2140 01/18/14 0220  WBC 8.0 3.9* 5.1  HGB 13.0 11.0*  11.0* 11.4*  11.3*  HCT 36.5* 31.2*  31.3* 32.3*  32.5*  PLT 109* 61* 66*   BMET  Recent Labs  01/17/14 1500 01/18/14 0220  NA 141 141  K 3.5* 4.3  CL 102 107  CO2 24 25  GLUCOSE 99 91  BUN 10 9  CREATININE 0.54 0.56  CALCIUM 8.7 8.4   LFT  Recent Labs  01/17/14 1500  PROT 7.0  ALBUMIN 3.4*  AST 44*  ALT 24  ALKPHOS 80  BILITOT 1.6*   PT/INR  Recent Labs  01/17/14 1500 01/18/14 0220  LABPROT 17.2* 16.7*  INR 1.44 1.39   Hepatitis Panel  Recent Labs  01/17/14 2140  HEPBSAG NEGATIVE  HCVAB NEGATIVE  HEPAIGM NON REACTIVE  HEPBIGM NON REACTIVE    Studies/Results: Dg Chest Portable 1 View  01/17/2014   CLINICAL DATA:  Chest pain and hematemesis  EXAM: PORTABLE CHEST - 1 VIEW  COMPARISON:  February 09, 2005  FINDINGS: There is no edema or consolidation. Heart is borderline enlarged with normal  pulmonary vascularity. No adenopathy. No pneumothorax. No bone lesions.  IMPRESSION: Heart borderline enlarged.  No edema or consolidation.   Electronically Signed   By: Bretta BangWilliam  Woodruff M.D.   On: 01/17/2014 15:35    ASSESMENT:   * Acute hematemesis in pt who carries diagnoses of esophageal varices and cirrhosis. Rule out variceal bleed, rule out ulcer bleed, rule out portal gastropathy.  *  drop in Hgb but not significantly anemic * Cirrhosis of the liver. 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 Has been on and off methadone maintenance for several years. Current maintenance program for about 12 months.     PLAN   *  Bedside EGD likely to be mid day today.  Will need intubation for this. Ccm aware    Jennye MoccasinSarah Gribbin  01/18/2014, 8:28 AM Pager: [phone]  GI ATTENDING  Agree with above. Patient in bathroom with melena.Family in room. Plan urgent EGD as soon as patient intubated (discussed with CCM just now).  Wilhemina BonitoJohn N. Eda KeysPerry, Jr., M.D. [facility] Division of Gastroenterology

## 2014-01-18 NOTE — Progress Notes (Signed)
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  SIGNIFICANT EVENTS / STUDIES:  1. Hematemesis 01/17/2014  LINES / TUBES: 1. Peripheral IVs only  CULTURES: 1. None  ANTIBIOTICS: 1. 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 Both were low volume. Th the recent hematemesis was preceded by a prodrome of "not feeling well" in the setting of a medically-supervised withdrawal program. He was told by the physician at the withdrawal program in fact he did not look good yesterday and was encouraged to present for attention, however, did not. The first episode of hematemesis earlier today occurred while Travis Beasley was playing video games. He denies recent fevers, chills, nausea, diarrhea, or increased confusion.   He tells me that he has not used non--prescribed opiates in several weeks, however, told the gastroenterologist earlier today that his last heroin use was 2 days ago. He denied injecting heroin gastroenterologist earlier today, however, reports to me that he has been in the past. He reports that he takes 1 mg of Xanax every night and has withdrawn from in the past.    Subjective :  Feeling better, no hematemesis  +melena this am  For endoscopy by GI this am   VITAL SIGNS: Temp:  [98.4 F (36.9 C)-99.7 F (37.6 C)] 99.7 F (37.6 C) (01/24 0411) Pulse Rate:  [80-121] 80 (01/24 0700) Resp:  [11-21] 16 (01/24 0700) BP: (100-183)/(43-93) 111/57 mmHg (01/24 0700) SpO2:  [93 %-100 %] 96 %  (01/24 0700) Weight:  [120.3 kg (265 lb 3.4 oz)-120.339 kg (265 lb 4.8 oz)] 120.3 kg (265 lb 3.4 oz) (01/24 0500)  HEMODYNAMICS:    VENTILATOR SETTINGS:    INTAKE / OUTPUT: Intake/Output     01/23 0701 - 01/24 0700 01/24 0701 - 01/25 0700   I.V. (mL/kg) 361.3 (3) 35 (0.3)   IV Piggyback 400    Total Intake(mL/kg) 761.3 (6.3) 35 (0.3)   Urine (mL/kg/hr)  425 (3.1)   Total Output   425   Net +761.3 -390        Urine Occurrence 700 x      PHYSICAL EXAMINATION: General:  No acute distress, slightly ashen color Neuro: intact  HEENT:  sclera anicteric, conjunctiva pink. Mucous membranes moist, oropharynx clear, poor dentition Neck:  trachea supple and midline, no lymphadenopathy or JVD Cardiovascular:  Regular rate rhythm, normal S1-S2, no murmurs rubs or gallops Lungs:   clear to auscultation bilaterally Abddomen:  S/NT/Mildly Distended/(+) BS Musculoskeletal:  1+ Pitting edema to knees bilaterally Skin:  Chronic venous stasis changes bilaterally   LABS:  CBC Recent Labs     01/17/14  1500  01/17/14  2140  01/18/14  0220  WBC  8.0  3.9*  5.1  HGB  13.0  11.0*  11.0*  11.4*  11.3*  HCT  36.5*  31.2*  31.3*  32.3*  32.5*  PLT  109*  61*  66*    Coag's Recent Labs     01/17/14  1500  01/18/14  0220  APTT  39*   --  INR  1.44  1.39    BMET Recent Labs     01/17/14  1500  01/18/14  0220  NA  141  141  K  3.5*  4.3  CL  102  107  CO2  24  25  BUN  10  9  CREATININE  0.54  0.56  GLUCOSE  99  91    Electrolytes Recent Labs     01/17/14  1500  01/18/14  0220  CALCIUM  8.7  8.4  MG   --   1.9  PHOS   --   3.6    Sepsis Markers No results found for this basename: LACTICACIDVEN, PROCALCITON, O2SATVEN,  in the last 72 hours  ABG No results found for this basename: PHART, PCO2ART, PO2ART,  in the last 72 hours  Liver Enzymes Recent Labs     01/17/14  1500  AST  44*  ALT  24  ALKPHOS  80  BILITOT  1.6*  ALBUMIN  3.4*    Cardiac  Enzymes No results found for this basename: TROPONINI, PROBNP,  in the last 72 hours  Glucose Recent Labs     01/17/14  2333  01/18/14  0406  GLUCAP  99  93    Imaging Dg Chest Portable 1 View  01/17/2014   CLINICAL DATA:  Chest pain and hematemesis  EXAM: PORTABLE CHEST - 1 VIEW  COMPARISON:  February 09, 2005  FINDINGS: There is no edema or consolidation. Heart is borderline enlarged with normal pulmonary vascularity. No adenopathy. No pneumothorax. No bone lesions.  IMPRESSION: Heart borderline enlarged.  No edema or consolidation.   Electronically Signed   By: Bretta Bang M.D.   On: 01/17/2014 15:35   CXR : NAD   ASSESSMENT / PLAN: Principal Problem:   Hematemesis Active Problems:   ANXIETY   Cirrhosis   Opiate addiction   Hypokalemia   Hepatic encephalopathy   PULMONARY A/P: intact 1. Plan elective intubation for EGD today 1/24  CARDIOVASCULAR A/P:  1. No acute issue  RENAL A/P:  1. Hypokalemia:  K+ replaced   GASTROINTESTINAL A/P:  1. Presumed variceal hemorrhage: Fortunately, Travis Beasley appears to be hemodynamically stable. His initial hemoglobin and did not reveal significant blood loss.  Octreotide and protonic drips  Serial CBCs  Type and cross 4 units of packed red blood cells-on hold   Empiric Cipro as has penicillin allergy  Plan for endoscopy this am per GI -intubation prior  HEMATOLOGIC A/P:   1. Anemia -Hemorrhagic  2. Thrombocytopenia   Plan  Follow serial cbc  Cont PPI   INFECTIOUS A/P: 1. ?Tooth abscess  Plan  Cont on empiric abx   ENDOCRINE A/P: 1. No acute issue  NEUROLOGIC A/P: 1. Hepatic Encephalopathy: Mild and likely 2/2 acute hemorrhage. /cirrhosis  2. Anxiety: Cannot simply stop the Xanax as patient is likely to withdraw. Given the likelihood of decompensation, will reduce dose. 3. Opiate addiction:  [facility] [employer] [phone]). -On Methadone 40mg    low-dose lactulose; target 3+  bowel movements daily  Decrease Xanax to 0.5 mg each bedtime   BEST PRACTICE / DISPOSITION Level of Care:  ICU Consultants:  GI Code Status:  Full Diet:  NPO except sips DVT Px:  SCDs GI Px:  PPI Drip Skin Integrity:  Intact Social / Family:  Patient able to update  TODAY'S SUMMARY:    35 minutes CC time  01/18/2014, 8:09 AM Tammy Parrett NP-C  [facility]  [phone]   Levy Pupa, MD, PhD 01/18/2014, 11:59 AM  Pulmonary and Critical Care [phone] or if no answer [phone]

## 2014-01-19 LAB — COMPREHENSIVE METABOLIC PANEL
ALK PHOS: 66 U/L (ref 39–117)
ALT: 17 U/L (ref 0–53)
AST: 33 U/L (ref 0–37)
Albumin: 3 g/dL — ABNORMAL LOW (ref 3.5–5.2)
BUN: 8 mg/dL (ref 6–23)
CALCIUM: 8.3 mg/dL — AB (ref 8.4–10.5)
CO2: 25 mEq/L (ref 19–32)
Chloride: 102 mEq/L (ref 96–112)
Creatinine, Ser: 0.65 mg/dL (ref 0.50–1.35)
GFR calc Af Amer: >90 mL/min (ref >90)
Glucose, Bld: 99 mg/dL (ref 70–99)
Potassium: 4.1 mEq/L (ref 3.7–5.3)
Sodium: 138 mEq/L (ref 137–147)
Total Bilirubin: 2.2 mg/dL — ABNORMAL HIGH (ref 0.3–1.2)
Total Protein: 6.2 g/dL (ref 6.0–8.3)

## 2014-01-19 LAB — CBC
HEMATOCRIT: 32.5 % — AB (ref 39.0–52.0)
HEMOGLOBIN: 11.2 g/dL — AB (ref 13.0–17.0)
MCH: 30.8 pg (ref 26.0–34.0)
MCHC: 34.5 g/dL (ref 30.0–36.0)
MCV: 89.3 fL (ref 78.0–100.0)
Platelets: 73 10*3/uL — ABNORMAL LOW (ref 150–400)
RBC: 3.64 MIL/uL — ABNORMAL LOW (ref 4.22–5.81)
RDW: 14.4 % (ref 11.5–15.5)
WBC: 6.5 10*3/uL (ref 4.0–10.5)

## 2014-01-19 LAB — AMMONIA: AMMONIA: 47 umol/L (ref 11–60)

## 2014-01-19 MED ORDER — METHADONE HCL 10 MG PO TABS
40.0000 mg | ORAL_TABLET | Freq: Every day | ORAL | Status: DC
  Administered 2014-01-19 – 2014-01-21 (×4): 40 mg via ORAL
  Filled 2014-01-19 (×4): qty 4

## 2014-01-19 MED ORDER — PROPRANOLOL HCL 10 MG PO TABS
10.0000 mg | ORAL_TABLET | Freq: Two times a day (BID) | ORAL | Status: DC
  Administered 2014-01-19 (×2): 10 mg via ORAL
  Filled 2014-01-19 (×4): qty 1

## 2014-01-19 MED ORDER — CIPROFLOXACIN HCL 500 MG PO TABS
500.0000 mg | ORAL_TABLET | Freq: Two times a day (BID) | ORAL | Status: DC
  Administered 2014-01-19 – 2014-01-21 (×5): 500 mg via ORAL
  Filled 2014-01-19 (×7): qty 1

## 2014-01-19 NOTE — Progress Notes (Signed)
Daily Rounding Note  01/19/2014, 8:26 AM  LOS: 2 days   SUBJECTIVE:       No significant stool.  No nausea.   OBJECTIVE:         Vital signs in last 24 hours:    Temp:  [98.9 F (37.2 C)-99.9 F (37.7 C)] 99.9 F (37.7 C) (01/25 0419) Pulse Rate:  [79-151] 79 (01/25 0600) Resp:  [11-31] 12 (01/25 0600) BP: (108-181)/(53-92) 118/61 mmHg (01/25 0600) SpO2:  [93 %-100 %] 93 % (01/25 0600) FiO2 (%):  [100 %] 100 % (01/24 1310) Weight:  [120.5 kg (265 lb 10.5 oz)] 120.5 kg (265 lb 10.5 oz) (01/25 0419)   General: looks pasty, comfortable   Heart: RRR Chest: clear bil Abdomen: soft, NT, ND  Extremities: no CCE Neuro/Psych:  Cooperative , fully alert.   Intake/Output from previous day: 01/24 0701 - 01/25 0700 In: 1626.4 [I.V.:1226.4; IV Piggyback:400] Out: 1025 [Urine:1025]  Intake/Output this shift:    Lab Results:  Recent Labs  01/18/14 1535 01/18/14 1713 01/18/14 2120 01/19/14 0237  WBC 6.8  --  6.5 6.5  HGB 11.1* 10.5* 11.5*  11.6* 11.2*  HCT 32.3* 30.5* 33.1*  33.3* 32.5*  PLT 69*  --  74* 73*   BMET  Recent Labs  01/17/14 1500 01/18/14 0220 01/19/14 0237  NA 141 141 138  K 3.5* 4.3 4.1  CL 102 107 102  CO2 24 25 25   GLUCOSE 99 91 99  BUN 10 9 8   CREATININE 0.54 0.56 0.65  CALCIUM 8.7 8.4 8.3*   LFT  Recent Labs  01/17/14 1500 01/19/14 0237  PROT 7.0 6.2  ALBUMIN 3.4* 3.0*  AST 44* 33  ALT 24 17  ALKPHOS 80 66  BILITOT 1.6* 2.2*   PT/INR  Recent Labs  01/17/14 1500 01/18/14 0220  LABPROT 17.2* 16.7*  INR 1.44 1.39   Hepatitis Panel  Recent Labs  01/17/14 2140  HEPBSAG NEGATIVE  HCVAB NEGATIVE  HEPAIGM NON REACTIVE  HEPBIGM NON REACTIVE    Studies/Results: Dg Chest Portable 1 View  01/17/2014   CLINICAL DATA:  Chest pain and hematemesis  EXAM: PORTABLE CHEST - 1 VIEW  COMPARISON:  February 09, 2005  FINDINGS: There is no edema or consolidation. Heart is  borderline enlarged with normal pulmonary vascularity. No adenopathy. No pneumothorax. No bone lesions.  IMPRESSION: Heart borderline enlarged.  No edema or consolidation.   Electronically Signed   By: Bretta BangWilliam  Woodruff M.D.   On: 01/17/2014 15:35    ASSESMENT:   *  Acute hematemesis EGD 1/24: 3+ esophageal varices with high risk stigmata, banded x 4.  Gastric varices noted. esoph varices are source of GI bleed Fortunately Hgb is stable, no transfusions to date. On Octreotide and Protonix drip, Cipro (PCN allergic) *  Cirrhosis.  Hx ETOH abuse.  Hep C Ab, Hep B surface AG, Hep B core IgM, Hep A IgM all negative. 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 HIV negative (per pt) in last 18 months.  *  Slight coagulopthy:  PT17.2 at admission, normal INR *  Hypoalbuminemia.    PLAN   *  Family aware pt requires repeat EGD in ~ 2 weeks with Dr Devona Konigri Le, [facility].  *  72 total hours of Octreotide.  *  Change to po Protonix *  5 total days of Cipro, can change to po *  Add Propanolol.  10 mg BID to start.  *  Full liquids *  Ok to transfer out of ICU.     Travis Beasley  01/19/2014, 8:26 AM Pager: [phone]  GI ATTENDING  Agree with above. Currently stable. Continue octreotide. Advance diet. Needs to be on beta blocker. Needs to do with substance abuse problems formally. Family aware. Okay to transfer out of ICU. Needs to have close followup with his primary gastroenterologist.  Wilhemina Bonito. Eda Keys., M.D. [facility] Division of Gastroenterology

## 2014-01-19 NOTE — Progress Notes (Signed)
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  SIGNIFICANT EVENTS / STUDIES:  1/23 - presented with hematemesis 1/24 - EGD showed large though non-bleeding varices, s/p banding x4  LINES / TUBES: PIV  CULTURES: None  ANTIBIOTICS: Cipro 1/23 >>   Subjective :  The patient underwent EGD yesterday, which showed large but non-bleeding varices, s/p banding x4.  The patient was intubated for the procedure and self-extubated after procedure, with no dyspnea and only mild sore throat this morning.  Hb stable this AM.  VITAL SIGNS: Temp:  [98.9 F (37.2 C)-99.9 F (37.7 C)] 99.9 F (37.7 C) (01/25 0419) Pulse Rate:  [79-151] 79 (01/25 0600) Resp:  [11-31] 12 (01/25 0600) BP: (108-181)/(53-92) 118/61 mmHg (01/25 0600) SpO2:  [93 %-100 %] 93 % (01/25 0600) FiO2 (%):  [100 %] 100 % (01/24 1310) Weight:  [265 lb 10.5 oz (120.5 kg)] 265 lb 10.5 oz (120.5 kg) (01/25 0419)  HEMODYNAMICS:    VENTILATOR SETTINGS: Vent Mode:  [-] PRVC FiO2 (%):  [100 %] 100 % Set Rate:  [16 bmp] 16 bmp Vt Set:  [640 mL] 640 mL PEEP:  [5 cmH20] 5 cmH20 Plateau Pressure:  [18 cmH20] 18 cmH20  INTAKE / OUTPUT: Intake/Output     01/24 0701 - 01/25 0700 01/25 0701 - 01/26 0700   I.V. (mL/kg) 1226.4 (10.2)    IV Piggyback 400    Total Intake(mL/kg) 1626.4 (13.5)    Urine (mL/kg/hr) 1025 (0.4)    Total Output 1025     Net +601.4            PHYSICAL EXAMINATION: General: alert, sitting in bed, no apparent distress HEENT: PERRL, EOMI, oropharynx non-erythematous Neck: supple Lungs: clear to ascultation bilaterally, normal work of respiration, no wheezes, rales,  ronchi Heart: regular rate and rhythm, no murmurs, gallops, or rubs Abdomen: soft, non-tender, non-distended, normal bowel sounds Extremities: no cyanosis, clubbing, or edema Neurologic: alert & oriented X3, cranial nerves II-XII intact, strength grossly intact, sensation intact to light touch  LABS:  CBC Recent Labs     01/18/14  1535  01/18/14  1713  01/18/14  2120  01/19/14  0237  WBC  6.8   --   6.5  6.5  HGB  11.1*  10.5*  11.5*  11.6*  11.2*  HCT  32.3*  30.5*  33.1*  33.3*  32.5*  PLT  69*   --   74*  73*    Coag's Recent Labs     01/17/14  1500  01/18/14  0220  APTT  39*   --   INR  1.44  1.39    BMET Recent Labs     01/17/14  1500  01/18/14  0220  01/19/14  0237  NA  141  141  138  K  3.5*  4.3  4.1  CL  102  107  102  CO2  24  25  25   BUN  10  9  8   CREATININE  0.54  0.56  0.65  GLUCOSE  99  91  99    Electrolytes Recent Labs     01/17/14  1500  01/18/14  0220  01/19/14  0237  CALCIUM  8.7  8.4  8.3*  MG   --   1.9   --   PHOS   --   3.6   --     Sepsis Markers No results found for this basename: LACTICACIDVEN, PROCALCITON, O2SATVEN,  in the last 72 hours  ABG No results found for this basename: PHART, PCO2ART, PO2ART,  in the last 72 hours  Liver Enzymes Recent Labs     01/17/14  1500  01/19/14  0237  AST  44*  33  ALT  24  17  ALKPHOS  80  66  BILITOT  1.6*  2.2*  ALBUMIN  3.4*  3.0*    Cardiac Enzymes No results found for this basename: TROPONINI, PROBNP,  in the last 72 hours  Glucose Recent Labs     01/17/14  2333  01/18/14  0406  01/18/14  0709  GLUCAP  99  93  104*    Imaging Dg Chest Portable 1 View  01/17/2014   CLINICAL DATA:  Chest pain and hematemesis  EXAM: PORTABLE CHEST - 1 VIEW  COMPARISON:  February 09, 2005  FINDINGS: There is no edema or consolidation. Heart is borderline enlarged with normal pulmonary vascularity. No adenopathy. No pneumothorax. No bone lesions.  IMPRESSION: Heart borderline  enlarged.  No edema or consolidation.   Electronically Signed   By: Bretta Bang M.D.   On: 01/17/2014 15:35   CXR 1/23: No acute abnormality  ASSESSMENT / PLAN: Principal Problem:   Hematemesis Active Problems:   ANXIETY   Cirrhosis   Opiate addiction   Hypokalemia   Hepatic encephalopathy   PULMONARY A: No acute issues - self-extubated overnight P:  -satting well on room air  CARDIOVASCULAR A: No acute issues P: -no intervention needed at this time  RENAL A: Hypokalemia - resolved P: -no acute issues   GASTROINTESTINAL A: Esophageal varices Hematemesis - resolved, likely 2/2 varices EtOH Cirrhosis P: -GI following, appreciate recs -continue octreotide and protonix drips -empiric cipro -continue lactulose  HEMATOLOGIC A: Acute blood loss anemia - 2/2 variceal bleeding Thrombocytopenia - 2/2 cirrhosis P: -check CBC BID -SCD's for DVT ppx  INFECTIOUS A: ?Tooth abscess P:  -continue empiric cipro  ENDOCRINE A: No acute issues P: -no intervention required  NEUROLOGIC A: Hepatic encephalopathy - mental status appears at baseline Anxiety - history of benzo withdrawal Opiate addiction P: -continue xanax qhs -methadone qhs -lactulose  Dispo - transfer to med-surg today and to [facility] as of 1/26  Janalyn Harder, PGY3 Pgr. [phone] 01/19/2014, 7:28 AM  Levy Pupa, MD, PhD 01/19/2014, 12:58 PM [facility] [phone] or if no answer [phone]

## 2014-01-19 NOTE — ED Provider Notes (Signed)
Medical screening examination/treatment/procedure(s) were conducted as a shared visit with resident-physician practitioner(s) and myself.  I personally evaluated the patient during the encounter.  Pt is a 38 y.o. male with pmhx of known esophageal varices and cirrhosis presenting with 1 episode of large volume hematemesis.  On PE, pt jaundiced, ill appearing. +epigastric ttp. IV protonix & octreotide started. Pt found to have acute hb drop, though did not requires blood products. Pt had no further episodes in ED.  HR improved w/ IVF, BP stable.  Pt admitted to ICU, GI will consult.   EKG Interpretation    Date/Time:  Friday January 17 2014 14:44:21 EST Ventricular Rate:  118 PR Interval:  138 QRS Duration: 92 QT Interval:  376 QTC Calculation: 527 R Axis:   71 Text Interpretation:  Sinus tachycardia ST \\T \ T wave abnormality, consider inferior ischemia Prolonged QT Abnormal ECG Confirmed by Saamiya Jeppsen  MD, Odarius Dines ([id number]) on 01/19/2014 1:01:28 AM             Shanna CiscoMegan E Adianna Darwin, MD 01/19/14 0104

## 2014-01-20 ENCOUNTER — Encounter (HOSPITAL_COMMUNITY): Payer: Self-pay | Admitting: Internal Medicine

## 2014-01-20 LAB — BASIC METABOLIC PANEL
BUN: 8 mg/dL (ref 6–23)
CALCIUM: 8.1 mg/dL — AB (ref 8.4–10.5)
CO2: 23 mEq/L (ref 19–32)
CREATININE: 0.59 mg/dL (ref 0.50–1.35)
Chloride: 106 mEq/L (ref 96–112)
GFR calc Af Amer: >90 mL/min (ref >90)
GFR calc non Af Amer: >90 mL/min (ref >90)
GLUCOSE: 81 mg/dL (ref 70–99)
Potassium: 3.9 mEq/L (ref 3.7–5.3)
Sodium: 142 mEq/L (ref 137–147)

## 2014-01-20 LAB — CBC
HCT: 30.7 % — ABNORMAL LOW (ref 39.0–52.0)
Hemoglobin: 10.6 g/dL — ABNORMAL LOW (ref 13.0–17.0)
MCH: 31 pg (ref 26.0–34.0)
MCHC: 34.5 g/dL (ref 30.0–36.0)
MCV: 89.8 fL (ref 78.0–100.0)
PLATELETS: 66 10*3/uL — AB (ref 150–400)
RBC: 3.42 MIL/uL — ABNORMAL LOW (ref 4.22–5.81)
RDW: 14.5 % (ref 11.5–15.5)
WBC: 5.4 10*3/uL (ref 4.0–10.5)

## 2014-01-20 MED ORDER — PROPRANOLOL HCL 20 MG PO TABS
20.0000 mg | ORAL_TABLET | Freq: Two times a day (BID) | ORAL | Status: DC
  Administered 2014-01-20 (×2): 20 mg via ORAL
  Filled 2014-01-20 (×5): qty 1

## 2014-01-20 MED ORDER — PROPRANOLOL HCL 20 MG PO TABS
20.0000 mg | ORAL_TABLET | Freq: Two times a day (BID) | ORAL | Status: DC
  Filled 2014-01-20: qty 1

## 2014-01-20 MED ORDER — PANTOPRAZOLE SODIUM 40 MG PO TBEC: 40.0000 mg | DELAYED_RELEASE_TABLET | Freq: Every day | ORAL | Status: DC

## 2014-01-20 MED ORDER — PANTOPRAZOLE SODIUM 40 MG PO TBEC
40.0000 mg | DELAYED_RELEASE_TABLET | Freq: Every day | ORAL | Status: DC
  Administered 2014-01-21: 40 mg via ORAL
  Filled 2014-01-20: qty 1

## 2014-01-20 NOTE — Progress Notes (Signed)
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 On Octreotide gtt since 1.23.15 Cipro started for "tooth pain" Mother in [place] 2[place]2 to MRSA abscess, [place] unknown of liver  SIGNIFICANT EVENTS / STUDIES:  1/23 - presented with hematemesis 1/24 - EGD showed large though non-bleeding varices, s/p banding x4  LINES / TUBES: PIV  CULTURES: None  ANTIBIOTICS: Cipro 1/23 >>   Subjective :  Doing fair. NO n/v No dark stool No CP Wants to eat  VITAL SIGNS: Temp:  [98.2 F (36.8 C)-98.5 F (36.9 C)] 98.4 F (36.9 C) (01/26 1006) Pulse Rate:  [71-86] 74 (01/26 1006) Resp:  [16-19] 16 (01/26 1006) BP: (104-139)/(60-88) 107/63 mmHg (01/26 1006) SpO2:  [93 %-99 %] 96 % (01/26 1006) Weight:  [119.931 kg (264 lb 6.4 oz)] 119.931 kg (264 lb 6.4 oz) (01/26 0545)  HEMODYNAMICS:    VENTILATOR SETTINGS:    INTAKE / OUTPUT: Intake/Output     01/25 0701 - 01/26 0700 01/26 0701 - 01/27 0700   I.V. (mL/kg) 180 (1.5)    IV Piggyback     Total Intake(mL/kg) 180 (1.5)    Urine (mL/kg/hr) 650 (0.2)    Total Output 650     Net -470          Urine Occurrence 1 x      PHYSICAL EXAMINATION: General: alert, sitting in bed, no apparent distress HEENT: PERRL, EOMI, oropharynx non-erythematous Neck: supple Lungs: clear to ascultation bilaterally, normal work of respiration, no wheezes, rales, ronchi Heart: regular rate and rhythm, no murmurs, gallops, or rubs Abdomen: soft, non-tender, non-distended, normal bowel sounds Extremities: no cyanosis, clubbing, or edema Neurologic: alert & oriented X3, cranial nerves II-XII intact, strength grossly intact, sensation intact to light touch  LABS:  CBC Recent Labs   01/18/14  2120  01/19/14  0237  01/20/14  0501  WBC  6.5  6.5  5.4  HGB  11.5*  11.6*  11.2*  10.6*  HCT  33.1*  33.3*  32.5*  30.7*  PLT  74*  73*  66*    Coag's Recent Labs     01/17/14  1500  01/18/14  0220  APTT  39*   --   INR  1.44  1.39    BMET Recent Labs     01/18/14  0220  01/19/14  0237  01/20/14  0501  NA  141  138  142  K  4.3  4.1  3.9  CL  107  102  106  CO2  25  25  23   BUN  9  8  8   CREATININE  0.56  0.65  0.59  GLUCOSE  91  99  81    Electrolytes Recent Labs     01/18/14  0220  01/19/14  0237  01/20/14  0501  CALCIUM  8.4  8.3*  8.1*  MG  1.9   --    --   PHOS  3.6   --    --     Sepsis Markers No results found for this basename: LACTICACIDVEN, PROCALCITON, O2SATVEN,  in the last 72 hours  ABG No results found for this basename: PHART, PCO2ART, PO2ART,  in the last 72 hours  Liver Enzymes Recent Labs  01/17/14  1500  01/19/14  0237  AST  44*  33  ALT  24  17  ALKPHOS  80  66  BILITOT  1.6*  2.2*  ALBUMIN  3.4*  3.0*    Cardiac Enzymes No results found for this basename: TROPONINI, PROBNP,  in the last 72 hours  Glucose Recent Labs     01/17/14  2333  01/18/14  0406  01/18/14  0709  GLUCAP  99  93  104*    Imaging No results found. CXR 1/23: No acute abnormality  ASSESSMENT / PLAN: Principal Problem:   Hematemesis Active Problems:   ANXIETY   Cirrhosis   Opiate addiction   Hypokalemia   Hepatic encephalopathy   PULMONARY A: No acute issues - self-extubated 1/24  CARDIOVASCULAR A: No acute issues P: -no intervention needed at this time  RENAL A: Hypokalemia - resolved P: -no acute issues   GASTROINTESTINAL A: Esophageal varices Hematemesis - resolved, likely 2/2 varices EtOH Cirrhosis P: -GI following, appreciate recs -continue octreotide and protonix drips-GI to delineate when these can stop [usually 72-96 hours is adequate?] -empiric cipro [presumably for tooth abcess as  well] -continue lactulose  HEMATOLOGIC A: Acute blood loss anemia - 2/2 variceal bleeding Thrombocytopenia - 2/2 cirrhosis P: -check CBC daily now-has been stable for 3 days -SCD's for DVT ppx  INFECTIOUS A: ?Tooth abscess P:  -continue empiric cipro -tranistion when PO's can be liberally given to Augmentin -dental appt needs to be made OP  ENDOCRINE A: No acute issues P: -no intervention required  NEUROLOGIC A: Hepatic encephalopathy - mental status appears at baseline Anxiety - history of benzo withdrawal Opiate addiction P: -continue xanax qhs -methadone qhs -lactulose  Dispo - INpatient pending GI input  Pleas Koch, MD [facility] (P) [phone]

## 2014-01-20 NOTE — Progress Notes (Signed)
Daily Rounding Note  01/20/2014, 10:36 AM  LOS: 3 days   SUBJECTIVE:       Stools brown/black.  No nausea or vomiting. Eating full liquids  OBJECTIVE:         Vital signs in last 24 hours:    Temp:  [98.2 F (36.8 C)-98.5 F (36.9 C)] 98.4 F (36.9 C) (01/26 1006) Pulse Rate:  [71-86] 74 (01/26 1006) Resp:  [16-19] 16 (01/26 1006) BP: (104-139)/(60-88) 107/63 mmHg (01/26 1006) SpO2:  [93 %-99 %] 96 % (01/26 1006) Weight:  [119.931 kg (264 lb 6.4 oz)] 119.931 kg (264 lb 6.4 oz) (01/26 0545) Last BM Date: 01/19/14 General: looks a little better, less pasty and pale   Heart: RRR Chest: clear bil Abdomen: soft, NT, ND.  No mass.  Active BS  Extremities: + venous stasis welling and hyperemia:  Improved from 3 days ago Neuro/Psych:  Cooperative, alert, no gross deficits. Not confused.   Intake/Output from previous day: 01/25 0701 - 01/26 0700 In: 180 [I.V.:180] Out: 650 [Urine:650]  Intake/Output this shift:    Lab Results:  Recent Labs  01/18/14 2120 01/19/14 0237 01/20/14 0501  WBC 6.5 6.5 5.4  HGB 11.5*  11.6* 11.2* 10.6*  HCT 33.1*  33.3* 32.5* 30.7*  PLT 74* 73* 66*   BMET  Recent Labs  01/18/14 0220 01/19/14 0237 01/20/14 0501  NA 141 138 142  K 4.3 4.1 3.9  CL 107 102 106  CO2 25 25 23   GLUCOSE 91 99 81  BUN 9 8 8   CREATININE 0.56 0.65 0.59  CALCIUM 8.4 8.3* 8.1*   LFT  Recent Labs  01/17/14 1500 01/19/14 0237  PROT 7.0 6.2  ALBUMIN 3.4* 3.0*  AST 44* 33  ALT 24 17  ALKPHOS 80 66  BILITOT 1.6* 2.2*   PT/INR  Recent Labs  01/17/14 1500 01/18/14 0220  LABPROT 17.2* 16.7*  INR 1.44 1.39   Hepatitis Panel  Recent Labs  01/17/14 2140  HEPBSAG NEGATIVE  HCVAB NEGATIVE  HEPAIGM NON REACTIVE  HEPBIGM NON REACTIVE    Studies/Results: No results found. . ALPRAZolam  0.5 mg Oral QHS  . ciprofloxacin  500 mg Oral BID  . influenza vac split quadrivalent PF  0.5 mL  Intramuscular Tomorrow-1000  . methadone  40 mg Oral QHS  . propranolol  10 mg Oral BID    ASSESMENT:   * Acute hematemesis  EGD 1/24: 3+ esophageal varices with high risk stigmata, banded x 4. Gastric varices noted. esoph varices are source of GI bleed.  No recurrent N/V  Hgb stable, no transfusions to date.  On Octreotide (72 hour infusion finishes today at 1500) and Protonix drip (finishes today at 1715) , Cipro day  (PCN allergic)  Started Propanolol 1/25 * Cirrhosis. Hx ETOH abuse. Hep C Ab, Hep B surface AG, Hep B core IgM, Hep A IgM all negative. T bili 2.2, otherwise normal LFTs  * Hx heroin addiction (snorted not injected), on Methadone maintenance. HIV negative (per pt) in last 18 months.  * Slight coagulopthy: PT17.2 at admission, normal INR  *  Hypoalbuminemia.  *  Thrombocytopenia.     PLAN   *  [facility] within 2 to 4 weeks of discharge.  *  Stop Cipro after 1/27 (after 5 days), finish Ocrtreotide and PPI drip later today, begin oral Protonix tomorrow. *  Increase Propanolol to 20 mg BID, titrate  dose to resting heart rate of 55 to 60 BPM (per Up to Date) *  Eventually should wean off Methadone, but since recent lower dose led to nausea, would be best to hold off on this for a few weeks in order to avoid precipitating withdrawal associated N/V.  Currently pt motivated to come off Methadone.  *  Substance abuse program: 12 step and/or outpt.  [facility], which I presume is an Training and development officer.  *  Protonix or other  PPI daily for 2 weeks then ok to stop.    Jennye Moccasin  01/20/2014, 10:36 AM Pager: [phone]  GI ATTENDING  History and interval data reviewed. Patient personally seen and examined. Agree with H&P as outlined above. The patient has had no further bleeding. Exam unremarkable.  RECOMMENDATIONS: 1. Discontinue octreotide 2. Initiate beta blocker for secondary  prophylaxis 3. Okay to discharge home tomorrow. He needs to followup with his gastroenterologist for repeat banding of his varices in 2-3 weeks. Also told to secure PCP now he has health insurance. Importance of avoidance of drug and alcohol stress. He understands.  Will sign off. Thank you  Wilhemina Bonito. Eda Keys., M.D. [facility] Division of Gastroenterology

## 2014-01-21 ENCOUNTER — Encounter: Payer: Self-pay | Admitting: Family Medicine

## 2014-01-21 LAB — DRUGS OF ABUSE SCREEN W/O ALC, ROUTINE URINE
AMPHETAMINE SCRN UR: NEGATIVE
BENZODIAZEPINES.: POSITIVE — AB
Barbiturate Quant, Ur: NEGATIVE
COCAINE METABOLITES: NEGATIVE
Creatinine,U: 106.3 mg/dL
Marijuana Metabolite: POSITIVE — AB
Methadone: POSITIVE — AB
OPIATE SCREEN, URINE: POSITIVE — AB
PHENCYCLIDINE (PCP): NEGATIVE
PROPOXYPHENE: NEGATIVE

## 2014-01-21 MED ORDER — PROPRANOLOL HCL 20 MG PO TABS: 20.0000 mg | ORAL_TABLET | Freq: Two times a day (BID) | ORAL | Status: AC

## 2014-01-21 MED ORDER — CIPROFLOXACIN HCL 500 MG PO TABS: 500.0000 mg | ORAL_TABLET | Freq: Two times a day (BID) | ORAL | Status: AC

## 2014-01-21 MED ORDER — PANTOPRAZOLE SODIUM 40 MG PO TBEC: 40.0000 mg | DELAYED_RELEASE_TABLET | Freq: Every day | ORAL | Status: AC

## 2014-01-21 MED ORDER — ALPRAZOLAM 0.5 MG PO TABS: 0.5000 mg | ORAL_TABLET | Freq: Every evening | ORAL | Status: AC | PRN

## 2014-01-21 NOTE — Discharge Summary (Signed)
Physician Discharge Summary  Travis PageRobert P Tregre AVW:[id number] DOB: 18-Mar-1976 DOA: 01/17/2014  PCP: No PCP Per Patient  Admit date: 01/17/2014 Discharge date: 01/21/2014  Time spent: 32 minutes  Recommendations for Outpatient Follow-up:  1. Follow with Methadone clinic  2. Follow with OP GI doc 3. Stop drinking/smoking 4.   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  On Octreotide gtt since 1.23.15  Cipro started for "tooth pain"  Was actually being weaned down off of methadone but had some nausea which might have been 2/2 to the wena, or 2/2 to Ibuprofen he took for Toothache Mother in [place] 2/2 to MRSA abscess, [place] unknown of liver  Had a scope/EGd showing non-bleeding varices, was on Octreotide Gtt till 1/26.  BB initiated bid as propanolol and Protonicx 40 started as well. Tolerated full diet and was felt suitable for d/c 1/27 Will need OP follow up for re-initiation as OP of methadone as well as ETOH cessation He takes Xanax prn qhs-was given 10 tablets of this.  Will need a PCP to help.  Numbers will be provided Needs dental appt as OP   SIGNIFICANT EVENTS / STUDIES:  1/23 - presented with hematemesis  1/24 - EGD showed large though non-bleeding varices, s/p banding x4  LINES / TUBES:  PIV  CULTURES:  None  ANTIBIOTICS:  Cipro 1/23 >> 1/27   Discharge Exam: Filed Vitals:   01/21/14 0525  BP: 105/54  Pulse: 65  Temp: 98.6 F (37 C)  Resp: 16   Alert  pleasant General: eomi, ncat Cardiovascular: s1 s2 no m/r/g Respiratory: clear  Discharge Instructions  Discharge Orders   Future Orders Complete By Expires   Diet - low sodium heart healthy  As directed    Discharge instructions  As directed    Comments:     [facility] within 2 to 4 weeks of discharge.   *  Stop Cipro after 1/27  *  Increase Propanolol to 20 mg twice daily *  Eventually should wean off Methadone, but since recent lower dose led to nausea, would be best to hold off on this for a few weeks in order to avoid precipitating withdrawal associated N/V.  Currently pt motivated to come off Methadone.   *  Substance abuse program: 12 step and/or outpt.  [facility] [place] which I presume [facility].   *  Take Proton pump inhibitor until your regular gastro doc tells you to stop   Increase activity slowly  As directed        Medication List         ALPRAZolam 0.5 MG tablet  Commonly known as:  XANAX  Take 1 tablet (0.5 mg total) by mouth at bedtime as needed for anxiety.     ciprofloxacin 500 MG tablet  Commonly known as:  CIPRO  Take 1 tablet (500 mg total) by  mouth 2 (two) times daily.     methadone 10 MG/5ML solution  Commonly known as:  DOLOPHINE  Take 40 mg by mouth daily.     pantoprazole 40 MG tablet  Commonly known as:  PROTONIX  Take 1 tablet (40 mg total) by mouth daily.     propranolol 20 MG tablet  Commonly known as:  INDERAL  Take 1 tablet (20 mg total) by mouth 2 (two) times daily.       Allergies  Allergen Reactions  . Cefaclor Hives  . Penicillins Hives       Follow-up Information   Follow up with LE,TRI H, MD. (call for appointment to be seen within 2 to 3 weeks.  Hospital can forward records to his office. )    Specialty:  Gastroenterology   Contact information:   [address] [place] [address] [phone]         The results of significant diagnostics from this hospitalization (including imaging, microbiology, ancillary and laboratory) are listed below for reference.    Significant Diagnostic Studies: Dg Chest Portable 1 View  01/17/2014   CLINICAL DATA:  Chest pain and hematemesis  EXAM: PORTABLE CHEST - 1 VIEW  COMPARISON:  February 09, 2005  FINDINGS: There is no edema or consolidation. Heart is borderline enlarged with normal pulmonary vascularity. No adenopathy. No pneumothorax. No bone lesions.  IMPRESSION: Heart borderline enlarged.  No edema or consolidation.   Electronically Signed   By: Bretta Bang M.D.   On: 01/17/2014 15:35    Microbiology: Recent Results (from the past 240 hour(s))  MRSA PCR SCREENING     Status: None   Collection Time    01/17/14  8:24 PM      Result Value Range Status   MRSA by PCR NEGATIVE  NEGATIVE Final   Comment:            The GeneXpert MRSA Assay (FDA     approved for NASAL specimens     only), is one component of a     comprehensive MRSA colonization     surveillance program. It is not     intended to diagnose MRSA     infection nor to guide or     monitor treatment for     MRSA infections.     Labs: Basic Metabolic Panel:  Recent Labs Lab 01/17/14 1500 01/18/14 0220 01/19/14 0237 01/20/14 0501  NA 141 141 138 142  K 3.5* 4.3 4.1 3.9  CL 102 107 102 106  CO2 24 25 25 23   GLUCOSE 99 91 99 81  BUN 10 9 8 8   CREATININE 0.54 0.56 0.65 0.59  CALCIUM 8.7 8.4 8.3* 8.1*  MG  --  1.9  --   --   PHOS  --  3.6  --   --    Liver Function Tests:  Recent Labs Lab 01/17/14 1500 01/19/14 0237  AST 44* 33  ALT 24 17  ALKPHOS 80 66  BILITOT 1.6* 2.2*  PROT 7.0 6.2  ALBUMIN 3.4* 3.0*   No results found for this basename: LIPASE, AMYLASE,  in the last 168 hours  Recent Labs Lab 01/19/14 0237  AMMONIA 47   CBC:  Recent Labs Lab 01/18/14 0220  01/18/14 1535 01/18/14 1713 01/18/14 2120 01/19/14 0237 01/20/14 0501  WBC  5.1  --  6.8  --  6.5 6.5 5.4  HGB 11.4*  11.3*  < > 11.1* 10.5* 11.5*  11.6* 11.2* 10.6*  HCT 32.3*  32.5*  < > 32.3* 30.5* 33.1*  33.3* 32.5* 30.7*  MCV 89.0  --  88.7  --  88.3 89.3 89.8  PLT 66*  --  69*  --  74* 73* 66*  < > = values in this interval not displayed. Cardiac Enzymes: No results found for this basename: CKTOTAL, CKMB, CKMBINDEX, TROPONINI,  in the last 168 hours BNP: BNP (last 3 results) No results found for this basename: PROBNP,  in the last 8760 hours CBG:  Recent Labs Lab 01/17/14 2333 01/18/14 0406 01/18/14 0709  GLUCAP 99 93 104*       Signed:  Schae Cando, JAI-GURMUKH  [facility] 01/21/2014, 8:20 AM

## 2014-01-21 NOTE — Discharge Planning (Signed)
Patient discharged home in stable condition. Verbalizes understanding of all discharge instructions, including home medications and follow up appointments. Pt states that he has a PCP and dentist that he will contact for an appt.

## 2014-01-22 LAB — THC (MARIJUANA), URINE, CONFIRMATION: Marijuana, Ur-Confirmation: 498 ng/mL

## 2014-01-27 LAB — OPIATE, QUANTITATIVE, URINE
6 Monoacetylmorphine, Ur-Confirm: 128 ng/mL — ABNORMAL HIGH
CODEINE URINE: 348 ng/mL
HYDROMORPHONE GC/MS CONF: 65 ng/mL
Hydrocodone: NEGATIVE ng/mL
Morphine, Confirm: 9148 ng/mL
Norhydrocodone, Ur: NEGATIVE ng/mL
Noroxycodone, Ur: NEGATIVE ng/mL
OXYMORPHONE: NEGATIVE ng/mL
Oxycodone, ur: NEGATIVE ng/mL

## 2014-01-27 LAB — BENZODIAZEPINE, QUANTITATIVE, URINE
Alprazolam (GC/LC/MS), ur confirm: 194 ng/mL
Alprazolam metabolite (GC/LC/MS), ur confirm: 166 ng/mL
Clonazepam metabolite (GC/LC/MS), ur confirm: NEGATIVE ng/mL
DIAZEPAMUC: NEGATIVE ng/mL
Estazolam (GC/LC/MS), ur confirm: NEGATIVE ng/mL
Flunitrazepam metabolite (GC/LC/MS), ur confirm: NEGATIVE ng/mL
Flurazepam GC/MS Conf: NEGATIVE ng/mL
Lorazepam (GC/LC/MS), ur confirm: NEGATIVE ng/mL
Midazolam (GC/LC/MS), ur confirm: NEGATIVE ng/mL
Nordiazepam GC/MS Conf: NEGATIVE ng/mL
Oxazepam GC/MS Conf: NEGATIVE ng/mL
TRIAZOLAMU: NEGATIVE ng/mL
Temazepam GC/MS Conf: NEGATIVE ng/mL

## 2014-01-27 LAB — METHADONE, CONFIRMATION
METHADONE GC/MS CONF: 6182 ng/mL — AB
Methadone metabolite, urine: 2011 ng/mL — ABNORMAL HIGH

## 2015-07-01 ENCOUNTER — Encounter (HOSPITAL_BASED_OUTPATIENT_CLINIC_OR_DEPARTMENT_OTHER): Payer: Self-pay | Admitting: Emergency Medicine

## 2015-07-01 ENCOUNTER — Emergency Department (HOSPITAL_BASED_OUTPATIENT_CLINIC_OR_DEPARTMENT_OTHER): Payer: 59

## 2015-07-01 ENCOUNTER — Emergency Department (HOSPITAL_BASED_OUTPATIENT_CLINIC_OR_DEPARTMENT_OTHER)
Admission: EM | Admit: 2015-07-01 | Discharge: 2015-07-01 | Disposition: A | Discharge status: Home / Self Care | Payer: 59 | Attending: Emergency Medicine | Admitting: Emergency Medicine

## 2015-07-01 DIAGNOSIS — Z720 Tobacco use: Secondary | ICD-10-CM | POA: Diagnosis not present

## 2015-07-01 DIAGNOSIS — F419 Anxiety disorder, unspecified: Secondary | ICD-10-CM | POA: Diagnosis not present

## 2015-07-01 DIAGNOSIS — Z792 Long term (current) use of antibiotics: Secondary | ICD-10-CM | POA: Insufficient documentation

## 2015-07-01 DIAGNOSIS — Z79899 Other long term (current) drug therapy: Secondary | ICD-10-CM | POA: Diagnosis not present

## 2015-07-01 DIAGNOSIS — I890 Lymphedema, not elsewhere classified: Secondary | ICD-10-CM | POA: Insufficient documentation

## 2015-07-01 DIAGNOSIS — K219 Gastro-esophageal reflux disease without esophagitis: Secondary | ICD-10-CM | POA: Diagnosis not present

## 2015-07-01 DIAGNOSIS — L089 Local infection of the skin and subcutaneous tissue, unspecified: Secondary | ICD-10-CM | POA: Diagnosis present

## 2015-07-01 DIAGNOSIS — L03116 Cellulitis of left lower limb: Secondary | ICD-10-CM

## 2015-07-01 DIAGNOSIS — Z880 Allergy status to penicillin: Secondary | ICD-10-CM | POA: Diagnosis not present

## 2015-07-01 LAB — BASIC METABOLIC PANEL
Anion gap: 7 (ref 5–15)
BUN: 6 mg/dL (ref 6–20)
CO2: 26 mmol/L (ref 22–32)
Calcium: 8 mg/dL — ABNORMAL LOW (ref 8.9–10.3)
Chloride: 97 mmol/L — ABNORMAL LOW (ref 101–111)
Creatinine, Ser: 0.71 mg/dL (ref 0.61–1.24)
GFR calc non Af Amer: >60 mL/min (ref >60)
Glucose, Bld: 139 mg/dL — ABNORMAL HIGH (ref 65–99)
POTASSIUM: 3.7 mmol/L (ref 3.5–5.1)
SODIUM: 130 mmol/L — AB (ref 135–145)

## 2015-07-01 LAB — CBC WITH DIFFERENTIAL/PLATELET
BASOS ABS: 0 10*3/uL (ref 0.0–0.1)
Basophils Relative: 0 % (ref 0–1)
Eosinophils Absolute: 0 10*3/uL (ref 0.0–0.7)
Eosinophils Relative: 0 % (ref 0–5)
HEMATOCRIT: 28.6 % — AB (ref 39.0–52.0)
HEMOGLOBIN: 8.7 g/dL — AB (ref 13.0–17.0)
LYMPHS ABS: 0.7 10*3/uL (ref 0.7–4.0)
LYMPHS PCT: 11 % — AB (ref 12–46)
MCH: 24.9 pg — ABNORMAL LOW (ref 26.0–34.0)
MCHC: 30.4 g/dL (ref 30.0–36.0)
MCV: 81.9 fL (ref 78.0–100.0)
MONOS PCT: 14 % — AB (ref 3–12)
Monocytes Absolute: 1 10*3/uL (ref 0.1–1.0)
Neutro Abs: 5.1 10*3/uL (ref 1.7–7.7)
Neutrophils Relative %: 75 % (ref 43–77)
PLATELETS: 60 10*3/uL — AB (ref 150–400)
RBC: 3.49 MIL/uL — ABNORMAL LOW (ref 4.22–5.81)
RDW: 17.9 % — AB (ref 11.5–15.5)
WBC: 6.8 10*3/uL (ref 4.0–10.5)

## 2015-07-01 LAB — I-STAT CG4 LACTIC ACID, ED: Lactic Acid, Venous: 2.39 mmol/L (ref 0.5–2.0)

## 2015-07-01 MED ORDER — VANCOMYCIN HCL IN DEXTROSE 1-5 GM/200ML-% IV SOLN: 1000.0000 mg | Freq: Once | INTRAVENOUS | Status: DC

## 2015-07-01 MED ORDER — CLINDAMYCIN HCL 300 MG PO CAPS: 300.0000 mg | ORAL_CAPSULE | Freq: Three times a day (TID) | ORAL | Status: AC

## 2015-07-01 MED ORDER — VANCOMYCIN HCL 10 G IV SOLR
1250.0000 mg | Freq: Three times a day (TID) | INTRAVENOUS | Status: DC
  Filled 2015-07-01: qty 1250

## 2015-07-01 MED ORDER — VANCOMYCIN HCL IN DEXTROSE 1-5 GM/200ML-% IV SOLN
1000.0000 mg | Freq: Once | INTRAVENOUS | Status: AC
  Administered 2015-07-01: 1000 mg via INTRAVENOUS
  Filled 2015-07-01: qty 200

## 2015-07-01 MED ORDER — SODIUM CHLORIDE 0.9 % IV SOLN: 2000.0000 mg | Freq: Once | INTRAVENOUS | Status: DC

## 2015-07-01 NOTE — Progress Notes (Addendum)
ANTIBIOTIC CONSULT NOTE - INITIAL  Pharmacy Consult for Vancomycin Indication: rule out pneumonia  Allergies  Allergen Reactions  . Cefaclor Hives  . Penicillins Hives    Patient Measurements: Height: 6' (182.9 cm) Weight: 275 lb (124.739 kg) IBW/kg (Calculated) : 77.6 Adjusted Body Weight:   Vital Signs: Temp: 100.8 F (38.2 C) (07/06 1935) Temp Source: Oral (07/06 1935) BP: 136/73 mmHg (07/06 1935) Pulse Rate: 86 (07/06 1935) Intake/Output from previous day:   Intake/Output from this shift:    Labs: No results for input(s): WBC, HGB, PLT, LABCREA, CREATININE in the last 72 hours. CrCl cannot be calculated (Patient has no serum creatinine result on file.). No results for input(s): VANCOTROUGH, VANCOPEAK, VANCORANDOM, GENTTROUGH, GENTPEAK, GENTRANDOM, TOBRATROUGH, TOBRAPEAK, TOBRARND, AMIKACINPEAK, AMIKACINTROU, AMIKACIN in the last 72 hours.   Microbiology: No results found for this or any previous visit (from the past 720 hour(s)).  Medical History: Past Medical History  Diagnosis Date  . Reflux   . Esophageal varices 2013    [facility]  . Anxiety     followed  before 2008 by Dr Dub MikesLugo  . Cirrhosis of liver   . Substance abuse     snorting heroin, abuse of ETOH  . Opiate addiction 01/17/2014    Medications:   (Not in a hospital admission) Scheduled:  Infusions:   Assessment: 39yo male with history of cirrhosis presents with frequent infections. Pharmacy is consulted to dose vancomycin for suspected pneumonia. Pt is febrile to 100.8, WBC wnl, LA 2.4, sCr wnl.  Goal of Therapy:  Vancomycin trough level 15-20 mcg/ml  Plan:  Vancomycin 2g IV load followed by 1250mg  q8h Expected duration 7 days with resolution of temperature and/or normalization of WBC Measure antibiotic drug levels at steady state Follow up culture results, renal function, and clinical course  Arlean HoppingCorey M. Newman PiesBall, PharmD Clinical Pharmacist Pager  [phone] 07/01/2015,8:28 PM

## 2015-07-01 NOTE — Discharge Instructions (Signed)
Take Antibiotic as prescribed.  Take an over-the-counter probiotic twice daily. Return to ER, or follow-up with her primary care physician if not improving. Stop poking your leg with pins!!!!!!   Cellulitis Cellulitis is an infection of the skin and the tissue under the skin. The infected area is usually red and tender. This happens most often in the arms and lower legs. HOME CARE   Take your antibiotic medicine as told. Finish the medicine even if you start to feel better.  Keep the infected arm or leg raised (elevated).  Put a warm cloth on the area up to 4 times per day.  Only take medicines as told by your doctor.  Keep all doctor visits as told. GET HELP IF:  You see red streaks on the skin coming from the infected area.  Your red area gets bigger or turns a dark color.  Your bone or joint under the infected area is painful after the skin heals.  Your infection comes back in the same area or different area.  You have a puffy (swollen) bump in the infected area.  You have new symptoms.  You have a fever. GET HELP RIGHT AWAY IF:   You feel very sleepy.  You throw up (vomit) or have watery poop (diarrhea).  You feel sick and have muscle aches and pains. MAKE SURE YOU:   Understand these instructions.  Will watch your condition.  Will get help right away if you are not doing well or get worse. Document Released: 05/30/2008 Document Revised: 04/28/2014 Document Reviewed: 02/27/2012 [facility] Patient Information 2015 [employer] [place]. This information is not intended to replace advice given to you by your health care provider. Make sure you discuss any questions you have with your health care provider.

## 2015-07-01 NOTE — ED Notes (Signed)
Patient reports that he had a area of swelling to his left leg near the wound that is being treated already at the wound clinic. The patient reports that this am he had a low grade increase in his temperature. The patient noticed a new area of swelling and pain. The patient denies that he has that area any more. Appointment tomorrow.

## 2015-07-01 NOTE — ED Notes (Addendum)
Awoke this morining with left ankle pain also had a fever of 100. Sees the wound center for the left foot. Redness and warmth to extremity.

## 2015-07-01 NOTE — ED Provider Notes (Signed)
CSN: [id number]     Arrival date & time 07/01/15  1928 History  This chart was scribed for Travis PorterMark Rivers Gassmann, MD by Chestine SporeSoijett Blue, ED Scribe. The patient was seen in room MH02/MH02 at 7:39 PM.     Chief Complaint  Patient presents with  . Frequent Infections      The history is provided by the patient. No language interpreter was used.    HPI Comments: Laverda PageRobert P Maddux is a 39 y.o. male with a medical hx of cirrhosis who presents to the Emergency Department complaining of frequent infections onset this morning. Pt notes that he goes to the wound center for treatment of his ulcer on his foot every week of his left foot and he has an appointment tomorrow. Pt notes that he would use a sewing pin and he would put it into his left leg and fluid would drain and he last did that 2 weeks ago. Pt notes that when he woke up this morning, there was cramping to his LLE and he tried to walk the pain away with no success. Pt reports that his pain is radiating up his left leg. He states that he is having associated symptoms of left ankle pain, max of 100 fever yesterday, redness to left foot, warmth, and chills. He denies any other symptoms. Pt is allergic to penicillin and cecel   Past Medical History  Diagnosis Date  . Reflux   . Esophageal varices 2013    [facility]  . Anxiety     followed  before 2008 by Dr Dub MikesLugo  . Cirrhosis of liver   . Substance abuse     snorting heroin, abuse of ETOH  . Opiate addiction 01/17/2014   Past Surgical History  Procedure Laterality Date  . Intubation  01/18/2014       . Esophagogastroduodenoscopy N/A 01/18/2014    Procedure: ESOPHAGOGASTRODUODENOSCOPY (EGD);  Surgeon: Hilarie FredricksonJohn N Perry, MD;  [facility];  Service: Endoscopy;  Laterality: N/A;   No family history on file. History  Substance Use Topics  . Smoking status: Current Every Day Smoker -- 0.50 packs/day    Types: Cigarettes  . Smokeless tobacco: Not on file  . Alcohol Use: No      Comment: stopped 2 yrs ago    Review of Systems  Constitutional: Positive for fever. Negative for chills, diaphoresis, appetite change and fatigue.  HENT: Negative for mouth sores, sore throat and trouble swallowing.   Eyes: Negative for visual disturbance.  Respiratory: Negative for cough, chest tightness, shortness of breath and wheezing.   Cardiovascular: Negative for chest pain.  Gastrointestinal: Negative for nausea, vomiting, abdominal pain, diarrhea and abdominal distention.  Endocrine: Negative for polydipsia, polyphagia and polyuria.  Genitourinary: Negative for dysuria, frequency and hematuria.  Musculoskeletal: Positive for arthralgias (left ankle). Negative for gait problem.  Skin: Positive for color change. Negative for pallor and rash.  Neurological: Negative for dizziness, syncope, light-headedness and headaches.  Hematological: Does not bruise/bleed easily.  Psychiatric/Behavioral: Negative for behavioral problems and confusion.      Allergies  Cefaclor and Penicillins  Home Medications   Prior to Admission medications   Medication Sig Start Date End Date Taking? Authorizing Provider  methadone (DOLOPHINE) 10 MG/5ML solution Take 90 mg by mouth daily.    Yes Historical Provider, MD  pantoprazole (PROTONIX) 40 MG tablet Take 1 tablet (40 mg total) by mouth daily. 01/21/14  Yes Rhetta MuraJai-Gurmukh Samtani, MD  propranolol (INDERAL) 20 MG tablet  Take 1 tablet (20 mg total) by mouth 2 (two) times daily. 01/21/14  Yes Rhetta Mura, MD  ALPRAZolam Prudy Feeler) 0.5 MG tablet Take 1 tablet (0.5 mg total) by mouth at bedtime as needed for anxiety. 01/21/14   Rhetta Mura, MD  ciprofloxacin (CIPRO) 500 MG tablet Take 1 tablet (500 mg total) by mouth 2 (two) times daily. 01/21/14   Rhetta Mura, MD  clindamycin (CLEOCIN) 300 MG capsule Take 1 capsule (300 mg total) by mouth 3 (three) times daily. 07/01/15   Travis Porter, MD   BP 141/68 mmHg  Pulse 86  Temp(Src) 100.4 F (38  C) (Oral)  Resp 20  Ht 6' (1.829 m)  Wt 275 lb (124.739 kg)  BMI 37.29 kg/m2  SpO2 99% Physical Exam  Constitutional: He is oriented to person, place, and time. He appears well-developed and well-nourished. No distress.  Morbidly obese  HENT:  Head: Normocephalic.  Eyes: Conjunctivae are normal. Pupils are equal, round, and reactive to light. No scleral icterus.  Neck: Normal range of motion. Neck supple. No thyromegaly present.  Cardiovascular: Normal rate and regular rhythm.  Exam reveals no gallop and no friction rub.   No murmur heard. Pulmonary/Chest: Effort normal and breath sounds normal. No respiratory distress. He has no wheezes. He has no rales.  Abdominal: Soft. Bowel sounds are normal. He exhibits no distension. There is no tenderness. There is no rebound.  Musculoskeletal: Normal range of motion.  Lymphedema mid thigh and distal erythema and warmth foot, calf, and streak to mid thigh medially. Good sensation.   Neurological: He is alert and oriented to person, place, and time.  Skin: Skin is warm and dry. No rash noted.  Psychiatric: He has a normal mood and affect. His behavior is normal.  Nursing note and vitals reviewed.   ED Course  Procedures (including critical care time) DIAGNOSTIC STUDIES: Oxygen Saturation is 99% on RA, nl by my interpretation.    COORDINATION OF CARE: 8:15 PM-Discussed treatment plan which includes [place] with pt at bedside and pt agreed to plan.   Labs Review Labs Reviewed  CBC WITH DIFFERENTIAL/PLATELET - Abnormal; Notable for the following:    RBC 3.49 (*)    Hemoglobin 8.7 (*)    HCT 28.6 (*)    MCH 24.9 (*)    RDW 17.9 (*)    Platelets 60 (*)    Lymphocytes Relative 11 (*)    Monocytes Relative 14 (*)    All other components within normal limits  BASIC METABOLIC PANEL - Abnormal; Notable for the following:    Sodium 130 (*)    Chloride 97 (*)    Glucose, Bld 139 (*)    Calcium 8.0 (*)    All other components within normal  limits  I-STAT CG4 LACTIC ACID, ED - Abnormal; Notable for the following:    Lactic Acid, Venous 2.39 (*)    All other components within normal limits    Imaging Review US Venous Img Lower Unilateral Left  07/01/2015   CLINICAL DATA:  . 39 year old male with left lower extremity swelling and fever.  EXAM: LEFT LOWER EXTREMITY VENOUS DOPPLER ULTRASOUND  TECHNIQUE: Gray-scale sonography with graded compression, as well as color Doppler and duplex ultrasound were performed to evaluate the lower extremity deep venous systems from the level of the common femoral vein and including the common femoral, femoral, profunda femoral, popliteal and calf veins including the posterior tibial, peroneal and gastrocnemius veins when visible. The superficial great saphenous vein was also interrogated.  Spectral Doppler was utilized to evaluate flow at rest and with distal augmentation maneuvers in the common femoral, femoral and popliteal veins.  COMPARISON:  None.  FINDINGS: Contralateral Common Femoral Vein: Respiratory phasicity is normal and symmetric with the symptomatic side. No evidence of thrombus. Normal compressibility.  Common Femoral Vein: No evidence of thrombus. Normal compressibility, respiratory phasicity and response to augmentation.  Saphenofemoral Junction: No evidence of thrombus. Normal compressibility and flow on color Doppler imaging.  Profunda Femoral Vein: No evidence of thrombus. Normal compressibility and flow on color Doppler imaging.  Femoral Vein: No evidence of thrombus. Normal compressibility, respiratory phasicity and response to augmentation.  Popliteal Vein: No evidence of thrombus. Normal compressibility, respiratory phasicity and response to augmentation.  Calf Veins: Not well visualized.  Superficial Great Saphenous Vein: No evidence of thrombus.  There is diffuse subcutaneous soft tissue edema of the calf.  IMPRESSION: No evidence of deep venous thrombosis.   Electronically Signed   By:  Elgie Collard M.D.   On: 07/01/2015 21:28     EKG Interpretation None      MDM   Final diagnoses:  Cellulitis of left lower extremity   Upon my initial evaluation I instructed the patient to be admitted to the hospital. He politely declines. Given him IV vancomycin. Prescription for clindamycin. I encouraged him in no uncertain terms to return if not improving at home.  He states he frequently poked his legs with pins to "let the fluid out". Urged to stop this practice.   I personally performed the services described in this documentation, which was scribed in my presence. The recorded information has been reviewed and is accurate.   Travis Porter, MD 07/01/15 2329

## 2016-07-17 IMAGING — US US EXTREM LOW VENOUS*L*
1 series · 13 of 24 positions shown · non-contrast
Comparison: None.

CLINICAL DATA: . 38-year-old male with left lower extremity
swelling and fever.



[Series 1: us extrem low venous*left* · 0.09mm/px · 13 of 31 slices shown]
[im 1/31]
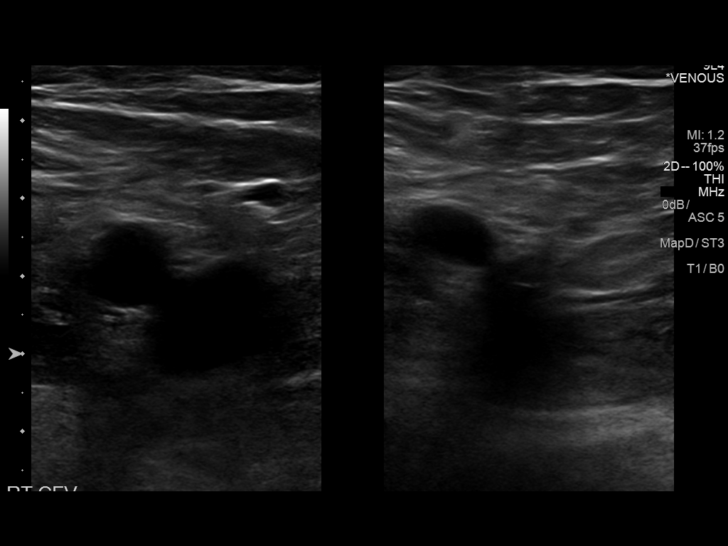
[im 3/31]
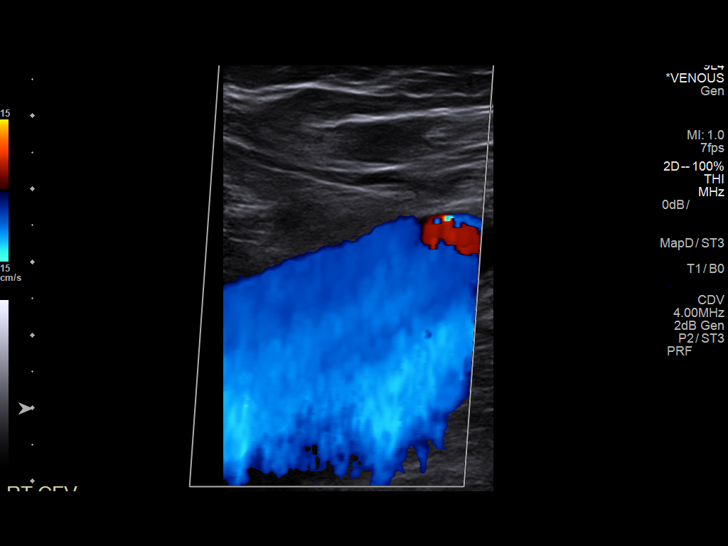
[im 6/31]
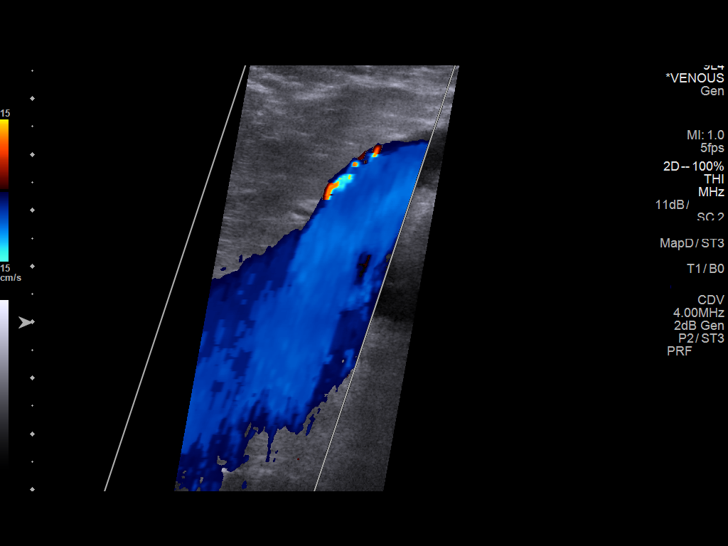
[im 8/31]
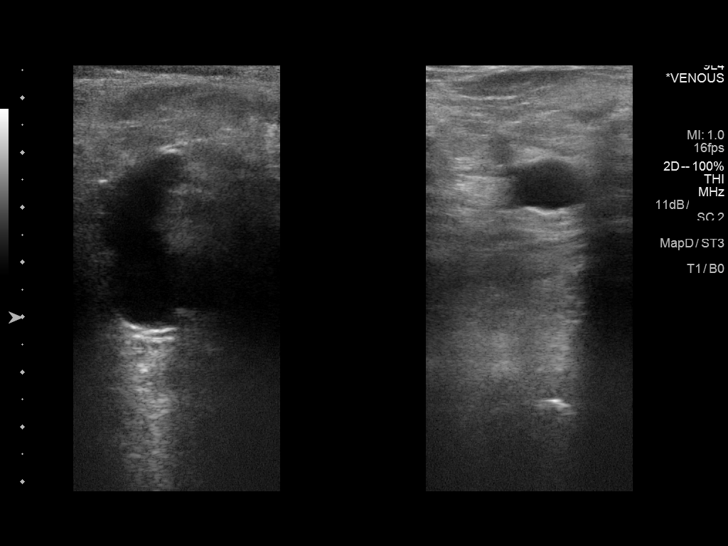
[im 11/31]
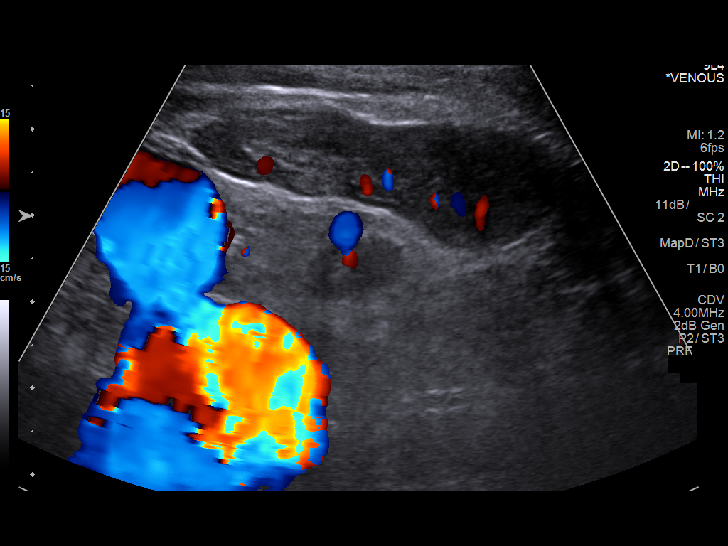
[im 14/31]
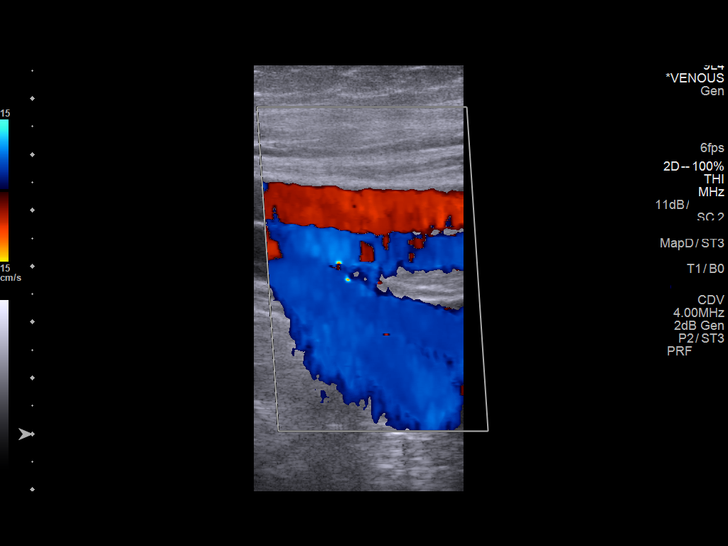
[im 16/31]
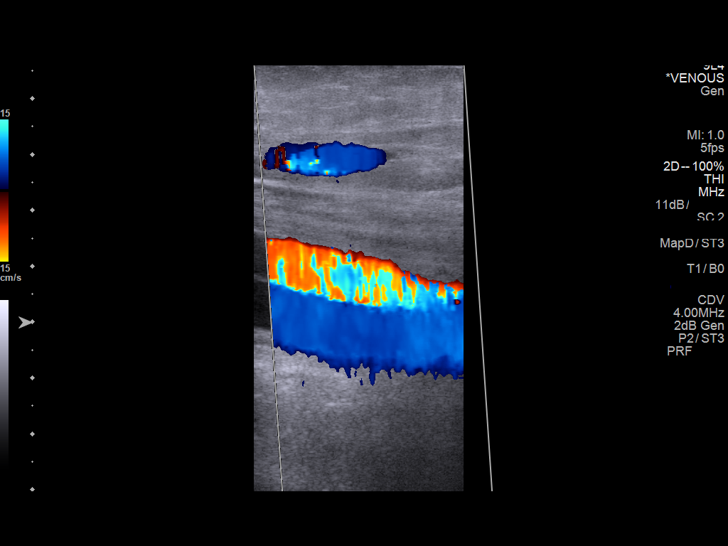
[im 17/31]
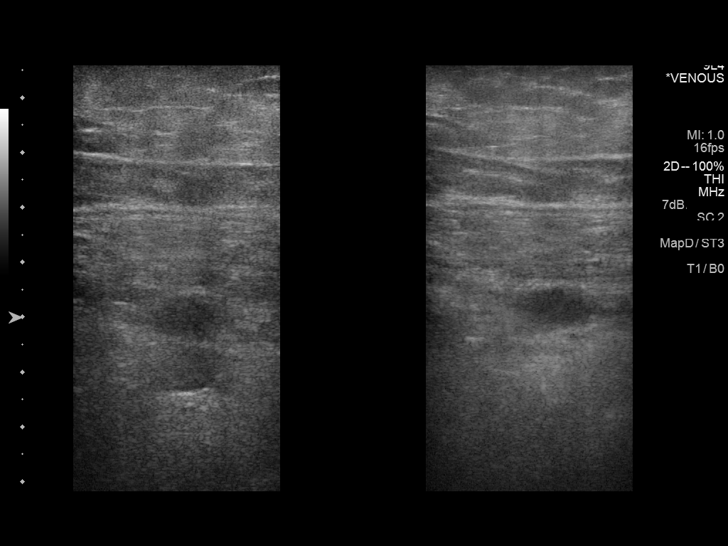
[im 20/31]
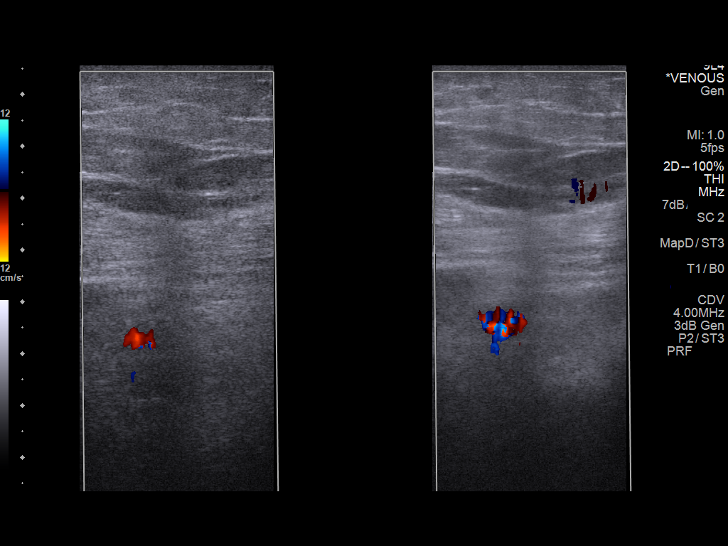
[im 23/31]
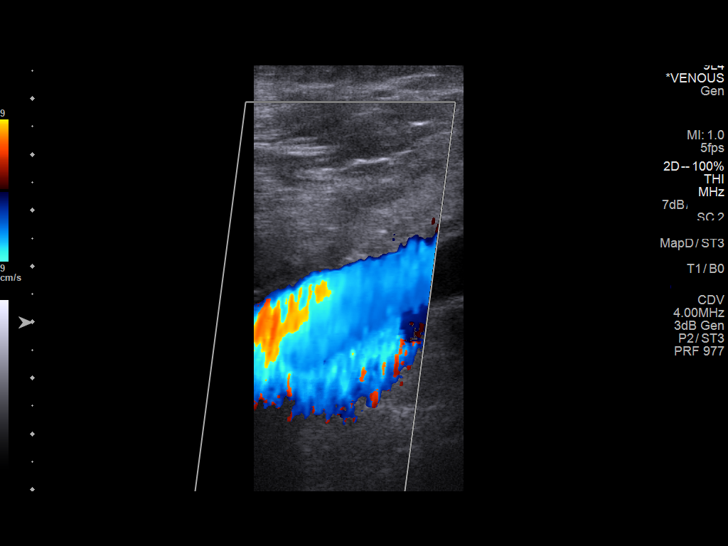
[im 25/31]
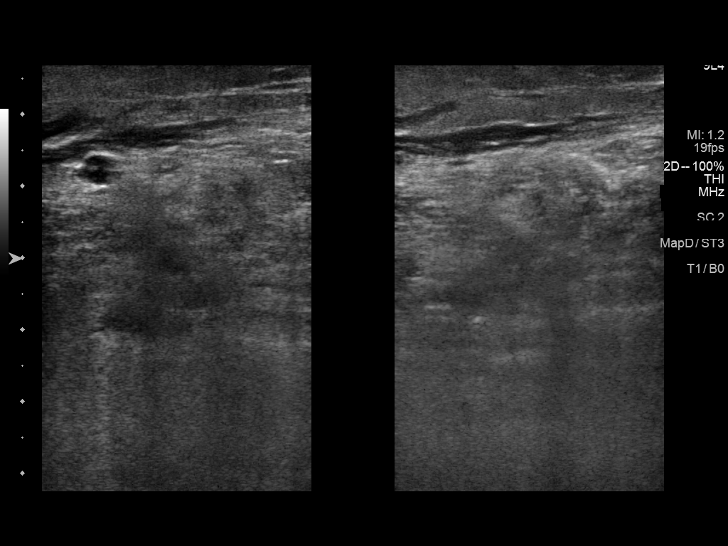
[im 28/31]
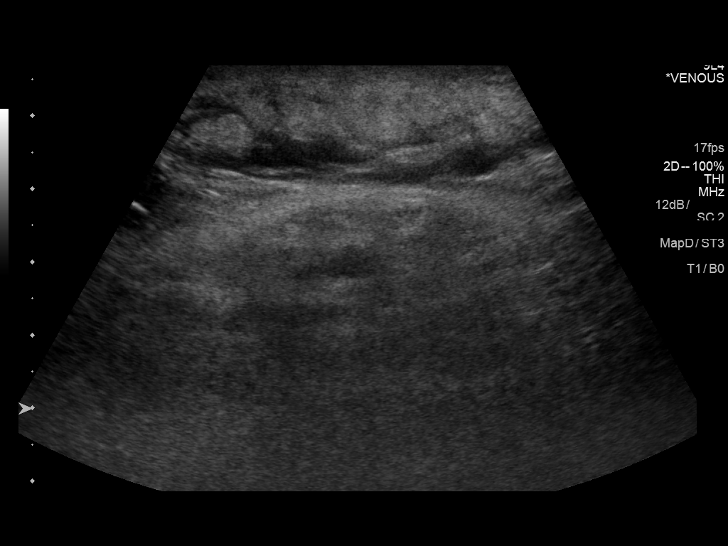
[im 31/31]
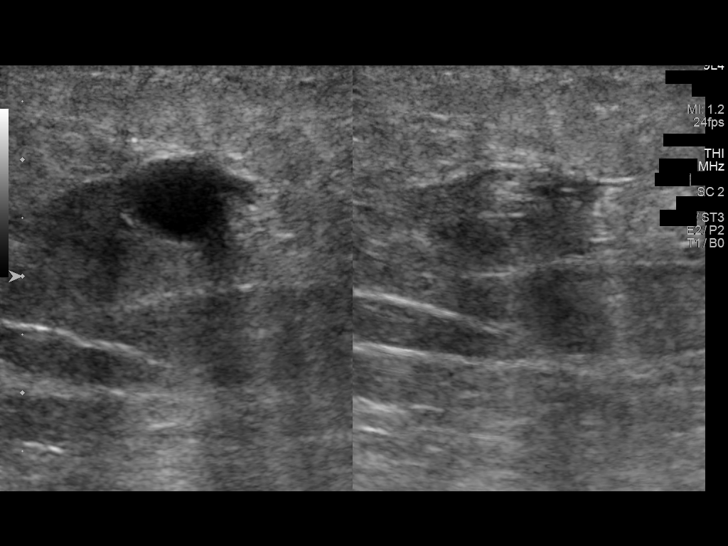

[13 of 24 positions shown; findings below may reference images not displayed]

FINDINGS: Contralateral Common Femoral Vein: Respiratory phasicity is normal
and symmetric with the symptomatic side. No evidence of thrombus.
Normal compressibility.

Common Femoral Vein: No evidence of thrombus. Normal
compressibility, respiratory phasicity and response to augmentation.

Saphenofemoral Junction: No evidence of thrombus. Normal
compressibility and flow on color Doppler imaging.

Profunda Femoral Vein: No evidence of thrombus. Normal
compressibility and flow on color Doppler imaging.

Femoral Vein: No evidence of thrombus. Normal compressibility,
respiratory phasicity and response to augmentation.

Popliteal Vein: No evidence of thrombus. Normal compressibility,
respiratory phasicity and response to augmentation.

Calf Veins: Not well visualized.

Superficial Great Saphenous Vein: No evidence of thrombus.

There is diffuse subcutaneous soft tissue edema of the calf.
IMPRESSION: No evidence of deep venous thrombosis.

## 2018-08-26 DEATH — deceased
